# Patient Record
Sex: Male | Born: 1950
Health system: Southern US, Community
[De-identification: ages and names within clinical notes are randomized; demographics above are authoritative.]

## PROBLEM LIST (undated history)

## (undated) DIAGNOSIS — Z973 Presence of spectacles and contact lenses: Secondary | ICD-10-CM

## (undated) DIAGNOSIS — R001 Bradycardia, unspecified: Secondary | ICD-10-CM

## (undated) DIAGNOSIS — G5600 Carpal tunnel syndrome, unspecified upper limb: Secondary | ICD-10-CM

## (undated) DIAGNOSIS — Z87438 Personal history of other diseases of male genital organs: Secondary | ICD-10-CM

## (undated) DIAGNOSIS — N503 Cyst of epididymis: Secondary | ICD-10-CM

## (undated) DIAGNOSIS — M545 Low back pain, unspecified: Secondary | ICD-10-CM

## (undated) DIAGNOSIS — F32A Depression, unspecified: Secondary | ICD-10-CM

## (undated) DIAGNOSIS — L309 Dermatitis, unspecified: Secondary | ICD-10-CM

## (undated) DIAGNOSIS — G5601 Carpal tunnel syndrome, right upper limb: Principal | ICD-10-CM

## (undated) DIAGNOSIS — R55 Syncope and collapse: Secondary | ICD-10-CM

## (undated) DIAGNOSIS — C61 Malignant neoplasm of prostate: Secondary | ICD-10-CM

## (undated) DIAGNOSIS — M1990 Unspecified osteoarthritis, unspecified site: Secondary | ICD-10-CM

## (undated) DIAGNOSIS — J329 Chronic sinusitis, unspecified: Secondary | ICD-10-CM

## (undated) DIAGNOSIS — I499 Cardiac arrhythmia, unspecified: Secondary | ICD-10-CM

## (undated) DIAGNOSIS — I493 Ventricular premature depolarization: Secondary | ICD-10-CM

## (undated) HISTORY — PX: PROSTATE BIOPSY: SHX241

## (undated) HISTORY — PX: COLONOSCOPY WITH PROPOFOL: SHX5780

## (undated) HISTORY — DX: Carpal tunnel syndrome, right upper limb: G56.01

---

## 2007-06-01 ENCOUNTER — Ambulatory Visit (HOSPITAL_BASED_OUTPATIENT_CLINIC_OR_DEPARTMENT_OTHER): Admission: RE | Admit: 2007-06-01 | Discharge: 2007-06-01 | Payer: Self-pay | Admitting: Urology

## 2007-06-01 HISTORY — PX: HYDROCELE EXCISION: SHX482

## 2010-07-17 NOTE — Op Note (Signed)
Calvin Howe, Calvin Howe              ACCOUNT NO.:  [id number]   MEDICAL RECORD NO.:  [id number]          PATIENT TYPE:  AMB   LOCATION:  [facility]                         FACILITY:  [facility]   PHYSICIAN:  Bertram Millard. Dahlstedt, M.D.DATE OF BIRTH:  [birth year]   DATE OF PROCEDURE:  06/01/2007  DATE OF DISCHARGE:                               OPERATIVE REPORT   PREOPERATIVE DIAGNOSIS:  Right hydrocele.   POSTOPERATIVE DIAGNOSIS:  Right hydrocele.   SURGICAL PROCEDURES:  Right hydrocelectomy.   SURGEON:  Bertram Millard. Dahlstedt, M.D.   FIRST ASSISTANT:  Delman Kitten, MD   ANESTHESIA:  General with LMA.   COMPLICATIONS:  None.   BRIEF HISTORY:  This 60 year old gentleman presented to my office late  last month with a right hydrocele.  It has been growing recently, and is  uncomfortable.  It is not huge, but the patient desires to have this  treated surgically.  Risks and complications of a hydrocelectomy were  discussed with the patient.  He understands these and desires to  proceed.   DESCRIPTION OF PROCEDURE:  The patient was identified in the holding  area.  The right side of his body was marked in accordance with same  site practice.  He received IV antibiotics, was taken to the operating  room where general anesthetic was administered using the LMA.  The  genitalia and perineum were prepped and draped.  The 4-cm incision was  made in the anterior median raphe of the scrotum and taken down to  tunica vaginalis with electrocautery.  The hydrocele was dissected  circumferentially with both blunt dissection and electrocautery.  It was  delivered from the scrotum.  His cord was quite thin.  The cord was  blocked with 5 mL of 0.25% plain Marcaine.  The hydrocele sac was opened  in the midline anteriorly taking care to avoid injury to the vas  deferens.  The hydrocele fluid was removed.  There was a small secondary  hydrocele/loculation which was also opened.  The entire anterior aspect  of the  hydrocele was opened, and the hydrocele sac was everted  posteriorly intact with running 2-0 chromics.  There was excellent  eversion, and no tissue was excised.  The testicle appeared normal there  was no evident lesion within the hydrocele sac.  At this point the  inside of the right hemiscrotum was then inspected and found to be  hemostatic.  Since there were no large vessels, there is no significant  bleeding and the hydrocele sac was quite easily imbricated posteriorly,  no drain was left.  The testicle and cord were replaced in the right  hemiscrotum.  The dartos fascia was reapproximated with a running 2-0  chromic.  Skin edges reapproximated using running 3-0  chromic placed in a running subcuticular fashion.  Dry sterile dressings  were placed.  The skin was also infiltrated with the remaining of the 10  mL of Marcaine.  Dry sterile dressings and a jock strap were placed.   The patient tolerated procedure well.  He was awakened, taken to PACU in  stable condition.  Bertram Millard. Dahlstedt, M.D.  Electronically Signed     SMD/MEDQ  D:  06/01/2007  T:  06/01/2007  Job:  [id number]   cc:   Molly Maduro L. Foy [place], M.D.  Fax: [phone]

## 2010-11-26 LAB — POCT HEMOGLOBIN-HEMACUE
Hemoglobin: 16.2
Operator id: 268271

## 2012-06-01 ENCOUNTER — Other Ambulatory Visit (HOSPITAL_BASED_OUTPATIENT_CLINIC_OR_DEPARTMENT_OTHER): Payer: Self-pay | Admitting: Family Medicine

## 2012-06-01 DIAGNOSIS — R748 Abnormal levels of other serum enzymes: Secondary | ICD-10-CM

## 2012-06-02 ENCOUNTER — Ambulatory Visit (HOSPITAL_BASED_OUTPATIENT_CLINIC_OR_DEPARTMENT_OTHER)
Admission: RE | Admit: 2012-06-02 | Discharge: 2012-06-02 | Disposition: A | Discharge status: Home / Self Care | Payer: Federal, State, Local not specified - PPO | Source: Ambulatory Visit | Attending: Family Medicine | Admitting: Family Medicine

## 2012-06-02 DIAGNOSIS — R7989 Other specified abnormal findings of blood chemistry: Secondary | ICD-10-CM | POA: Insufficient documentation

## 2012-06-02 DIAGNOSIS — R5381 Other malaise: Secondary | ICD-10-CM | POA: Insufficient documentation

## 2012-06-02 DIAGNOSIS — R748 Abnormal levels of other serum enzymes: Secondary | ICD-10-CM

## 2014-10-03 HISTORY — PX: COLONOSCOPY WITH PROPOFOL: SHX5780

## 2014-11-08 ENCOUNTER — Other Ambulatory Visit: Payer: Self-pay | Admitting: Urology

## 2014-12-06 ENCOUNTER — Encounter (HOSPITAL_BASED_OUTPATIENT_CLINIC_OR_DEPARTMENT_OTHER): Payer: Self-pay | Admitting: *Deleted

## 2014-12-06 NOTE — Progress Notes (Signed)
NPO AFTER MN.  ARRIVE AT 1000.  NEEDS HG.  

## 2014-12-12 ENCOUNTER — Ambulatory Visit (HOSPITAL_BASED_OUTPATIENT_CLINIC_OR_DEPARTMENT_OTHER): Payer: Federal, State, Local not specified - PPO | Admitting: Anesthesiology

## 2014-12-12 ENCOUNTER — Encounter (HOSPITAL_BASED_OUTPATIENT_CLINIC_OR_DEPARTMENT_OTHER)
Admission: RE | Disposition: A | Discharge status: Home / Self Care | Payer: Self-pay | Source: Ambulatory Visit | Attending: Urology

## 2014-12-12 ENCOUNTER — Ambulatory Visit (HOSPITAL_BASED_OUTPATIENT_CLINIC_OR_DEPARTMENT_OTHER)
Admission: RE | Admit: 2014-12-12 | Discharge: 2014-12-12 | Disposition: A | Discharge status: Home / Self Care | Payer: Federal, State, Local not specified - PPO | Source: Ambulatory Visit | Attending: Urology | Admitting: Urology

## 2014-12-12 ENCOUNTER — Encounter (HOSPITAL_BASED_OUTPATIENT_CLINIC_OR_DEPARTMENT_OTHER): Payer: Self-pay | Admitting: *Deleted

## 2014-12-12 DIAGNOSIS — N50811 Right testicular pain: Secondary | ICD-10-CM | POA: Insufficient documentation

## 2014-12-12 DIAGNOSIS — M1990 Unspecified osteoarthritis, unspecified site: Secondary | ICD-10-CM | POA: Insufficient documentation

## 2014-12-12 DIAGNOSIS — N503 Cyst of epididymis: Secondary | ICD-10-CM | POA: Insufficient documentation

## 2014-12-12 HISTORY — DX: Unspecified osteoarthritis, unspecified site: M19.90

## 2014-12-12 HISTORY — PX: EPIDIDYMECTOMY: SHX6275

## 2014-12-12 HISTORY — DX: Chronic sinusitis, unspecified: J32.9

## 2014-12-12 HISTORY — DX: Cyst of epididymis: N50.3

## 2014-12-12 HISTORY — DX: Dermatitis, unspecified: L30.9

## 2014-12-12 HISTORY — DX: Carpal tunnel syndrome, unspecified upper limb: G56.00

## 2014-12-12 HISTORY — DX: Personal history of other diseases of male genital organs: Z87.438

## 2014-12-12 HISTORY — DX: Presence of spectacles and contact lenses: Z97.3

## 2014-12-12 LAB — POCT HEMOGLOBIN-HEMACUE: Hemoglobin: 15.8 g/dL (ref 13.0–17.0)

## 2014-12-12 SURGERY — EPIDIDYMECTOMY
Anesthesia: General | Laterality: Right

## 2014-12-12 MED ORDER — LIDOCAINE HCL (CARDIAC) 20 MG/ML IV SOLN
INTRAVENOUS | Status: DC | PRN
  Administered 2014-12-12: 80 mg via INTRAVENOUS

## 2014-12-12 MED ORDER — LACTATED RINGERS IV SOLN
INTRAVENOUS | Status: DC
  Administered 2014-12-12: 11:00:00 via INTRAVENOUS
  Filled 2014-12-12: qty 1000

## 2014-12-12 MED ORDER — GLYCOPYRROLATE 0.2 MG/ML IJ SOLN
INTRAMUSCULAR | Status: DC | PRN
  Administered 2014-12-12: 0.2 mg via INTRAVENOUS

## 2014-12-12 MED ORDER — MIDAZOLAM HCL 5 MG/5ML IJ SOLN
INTRAMUSCULAR | Status: DC | PRN
  Administered 2014-12-12: 2 mg via INTRAVENOUS

## 2014-12-12 MED ORDER — PROPOFOL 10 MG/ML IV BOLUS
INTRAVENOUS | Status: DC | PRN
  Administered 2014-12-12: 50 mg via INTRAVENOUS
  Administered 2014-12-12: 200 mg via INTRAVENOUS

## 2014-12-12 MED ORDER — CEFAZOLIN SODIUM-DEXTROSE 2-3 GM-% IV SOLR
2.0000 g | INTRAVENOUS | Status: AC
  Administered 2014-12-12: 2 g via INTRAVENOUS
  Filled 2014-12-12: qty 50

## 2014-12-12 MED ORDER — OXYCODONE-ACETAMINOPHEN 10-325 MG PO TABS: 1.0000 | ORAL_TABLET | ORAL | Status: DC | PRN

## 2014-12-12 MED ORDER — EPHEDRINE SULFATE 50 MG/ML IJ SOLN
INTRAMUSCULAR | Status: DC | PRN
  Administered 2014-12-12: 10 mg via INTRAVENOUS
  Administered 2014-12-12: 15 mg via INTRAVENOUS
  Administered 2014-12-12: 10 mg via INTRAVENOUS
  Administered 2014-12-12: 15 mg via INTRAVENOUS

## 2014-12-12 MED ORDER — ONDANSETRON HCL 4 MG/2ML IJ SOLN
INTRAMUSCULAR | Status: DC | PRN
  Administered 2014-12-12: 4 mg via INTRAVENOUS

## 2014-12-12 MED ORDER — FENTANYL CITRATE (PF) 100 MCG/2ML IJ SOLN
25.0000 ug | INTRAMUSCULAR | Status: DC | PRN
  Filled 2014-12-12: qty 1

## 2014-12-12 MED ORDER — MIDAZOLAM HCL 2 MG/2ML IJ SOLN
INTRAMUSCULAR | Status: AC
  Filled 2014-12-12: qty 2

## 2014-12-12 MED ORDER — CEFAZOLIN SODIUM-DEXTROSE 2-3 GM-% IV SOLR
INTRAVENOUS | Status: AC
  Filled 2014-12-12: qty 50

## 2014-12-12 MED ORDER — ACETAMINOPHEN 10 MG/ML IV SOLN
INTRAVENOUS | Status: DC | PRN
  Administered 2014-12-12: 1000 mg via INTRAVENOUS

## 2014-12-12 MED ORDER — BUPIVACAINE HCL (PF) 0.25 % IJ SOLN
INTRAMUSCULAR | Status: DC | PRN
  Administered 2014-12-12: 20 mL

## 2014-12-12 MED ORDER — CEFAZOLIN SODIUM 1-5 GM-% IV SOLN
1.0000 g | INTRAVENOUS | Status: DC
  Filled 2014-12-12: qty 50

## 2014-12-12 MED ORDER — ONDANSETRON HCL 4 MG/2ML IJ SOLN
4.0000 mg | Freq: Once | INTRAMUSCULAR | Status: DC | PRN
  Filled 2014-12-12: qty 2

## 2014-12-12 MED ORDER — FENTANYL CITRATE (PF) 100 MCG/2ML IJ SOLN
INTRAMUSCULAR | Status: DC | PRN
  Administered 2014-12-12: 100 ug via INTRAVENOUS

## 2014-12-12 MED ORDER — FENTANYL CITRATE (PF) 100 MCG/2ML IJ SOLN
INTRAMUSCULAR | Status: AC
  Filled 2014-12-12: qty 4

## 2014-12-12 MED ORDER — SULFAMETHOXAZOLE-TRIMETHOPRIM 800-160 MG PO TABS: 1.0000 | ORAL_TABLET | Freq: Two times a day (BID) | ORAL | Status: DC

## 2014-12-12 SURGICAL SUPPLY — 44 items
APPLICATOR COTTON TIP 6IN STRL (MISCELLANEOUS) IMPLANT
BLADE CLIPPER SURG (BLADE) IMPLANT
BLADE SURG 15 STRL LF DISP TIS (BLADE) ×1 IMPLANT
BLADE SURG 15 STRL SS (BLADE) ×2
CLEANER CAUTERY TIP 5X5 PAD (MISCELLANEOUS) ×1 IMPLANT
CLOTH BEACON ORANGE TIMEOUT ST (SAFETY) ×3 IMPLANT
COVER BACK TABLE 60X90IN (DRAPES) ×3 IMPLANT
COVER MAYO STAND STRL (DRAPES) ×3 IMPLANT
DRAIN PENROSE 18X1/2 LTX STRL (DRAIN) ×3 IMPLANT
DRAPE PED LAPAROTOMY (DRAPES) ×3 IMPLANT
DRSG KUZMA FLUFF (GAUZE/BANDAGES/DRESSINGS) ×3 IMPLANT
DRSG TEGADERM 4X4.75 (GAUZE/BANDAGES/DRESSINGS) IMPLANT
ELECT REM PT RETURN 9FT ADLT (ELECTROSURGICAL) ×3
ELECTRODE REM PT RTRN 9FT ADLT (ELECTROSURGICAL) ×1 IMPLANT
GAUZE SPONGE 4X4 16PLY XRAY LF (GAUZE/BANDAGES/DRESSINGS) ×3 IMPLANT
GLOVE BIO SURGEON STRL SZ 6.5 (GLOVE) ×2 IMPLANT
GLOVE BIO SURGEON STRL SZ8 (GLOVE) ×3 IMPLANT
GLOVE BIO SURGEONS STRL SZ 6.5 (GLOVE) ×1
GLOVE INDICATOR 6.5 STRL GRN (GLOVE) ×3 IMPLANT
GLOVE INDICATOR 7.5 STRL GRN (GLOVE) ×3 IMPLANT
GOWN STRL REUS W/ TWL LRG LVL3 (GOWN DISPOSABLE) ×1 IMPLANT
GOWN STRL REUS W/ TWL XL LVL3 (GOWN DISPOSABLE) ×1 IMPLANT
GOWN STRL REUS W/TWL LRG LVL3 (GOWN DISPOSABLE) ×2
GOWN STRL REUS W/TWL XL LVL3 (GOWN DISPOSABLE) ×2
LIQUID BAND (GAUZE/BANDAGES/DRESSINGS) ×3 IMPLANT
NEEDLE HYPO 25X1 1.5 SAFETY (NEEDLE) ×3 IMPLANT
NS IRRIG 500ML POUR BTL (IV SOLUTION) ×3 IMPLANT
PACK BASIN DAY SURGERY FS (CUSTOM PROCEDURE TRAY) ×3 IMPLANT
PAD CLEANER CAUTERY TIP 5X5 (MISCELLANEOUS) ×2
PENCIL BUTTON HOLSTER BLD 10FT (ELECTRODE) ×3 IMPLANT
SUPPORT SCROTAL LG STRP (MISCELLANEOUS) ×2 IMPLANT
SUPPORTER ATHLETIC LG (MISCELLANEOUS) ×1
SUT CHROMIC 3 0 PS 2 (SUTURE) ×6 IMPLANT
SUT MNCRL AB 4-0 PS2 18 (SUTURE) ×3 IMPLANT
SUT VIC AB 3-0 SH 27 (SUTURE) ×6
SUT VIC AB 3-0 SH 27X BRD (SUTURE) ×3 IMPLANT
SUT VIC AB 4-0 SH 27 (SUTURE) ×2
SUT VIC AB 4-0 SH 27XANBCTRL (SUTURE) ×1 IMPLANT
SYR CONTROL 10ML LL (SYRINGE) ×3 IMPLANT
TOWEL OR 17X24 6PK STRL BLUE (TOWEL DISPOSABLE) ×6 IMPLANT
TRAY DSU PREP LF (CUSTOM PROCEDURE TRAY) ×3 IMPLANT
TUBE CONNECTING 12'X1/4 (SUCTIONS)
TUBE CONNECTING 12X1/4 (SUCTIONS) IMPLANT
YANKAUER SUCT BULB TIP NO VENT (SUCTIONS) ×3 IMPLANT

## 2014-12-12 NOTE — H&P (Signed)
Urology Admission H&P  Chief Complaint: right testicular pain  History of Present Illness: Calvin Howe is a 64yo with a hx of right testicular pain who was found to have a large right epididymal cyst. He continues to have dull intermittent nonradiaitng right  testiocular pain that is worse with movement.  Past Medical History  Diagnosis Date  . Epididymal cyst     right  . History of hydrocele   . Carpal tunnel syndrome   . Sinus infection     STARTED ANTIBIOTIC 12-01-2014  . Arthritis   . Wears glasses   . Eczema    Past Surgical History  Procedure Laterality Date  . Hydrocele excision Right 06-01-2007  . Colonoscopy with propofol  Aug 2016    Home Medications:  Prescriptions prior to admission  Medication Sig Dispense Refill Last Dose  . ibuprofen (ADVIL,MOTRIN) 800 MG tablet Take 800 mg by mouth every 8 (eight) hours as needed.   Past Month at Unknown time   Allergies: No Known Allergies  History reviewed. No pertinent family history. Social History:  reports that he has never smoked. He has never used smokeless tobacco. He reports that he drinks alcohol. He reports that he does not use illicit drugs.  Review of Systems  All other systems reviewed and are negative.   Physical Exam:  Vital signs in last 24 hours: Temp:  [97.9 F (36.6 C)] 97.9 F (36.6 C) (10/10 1015) Pulse Rate:  [58] 58 (10/10 1015) Resp:  [16] 16 (10/10 1015) BP: (138)/(77) 138/77 mmHg (10/10 1015) SpO2:  [100 %] 100 % (10/10 1015) Weight:  [77.338 kg (170 lb 8 oz)] 77.338 kg (170 lb 8 oz) (10/10 1015) Physical Exam  Constitutional: He is oriented to person, place, and time. He appears well-developed and well-nourished.  HENT:  Head: Normocephalic and atraumatic.  Eyes: EOM are normal. Pupils are equal, round, and reactive to light.  Neck: Normal range of motion. No thyromegaly present.  Cardiovascular: Normal rate and regular rhythm.   Respiratory: Effort normal. No respiratory distress.   GI: Soft. He exhibits no distension.  Genitourinary: Penis normal.  Musculoskeletal: Normal range of motion.  Neurological: He is alert and oriented to person, place, and time.  Skin: Skin is warm and dry.  Psychiatric: He has a normal mood and affect. His behavior is normal. Judgment and thought content normal.    Laboratory Data:  Results for orders placed or performed during the hospital encounter of 12/12/14 (from the past 24 hour(s))  Hemoglobin-hemacue, POC     Status: None   Collection Time: 12/12/14 11:15 AM  Result Value Ref Range   Hemoglobin 15.8 13.0 - 17.0 g/dL   No results found for this or any previous visit (from the past 240 hour(s)). Creatinine: No results for input(s): CREATININE in the last 168 hours. Baseline Creatinine: unknown  Impression/Assessment:  64yo with right epididymal cyst  Plan:  The risks/benefits/alternatives to right epididymal cyst excision was explained to the patient and he understands and wishes to proceed with surgery  Ted Goodner L 12/12/2014, 11:18 AM

## 2014-12-12 NOTE — Brief Op Note (Signed)
12/12/2014  1:01 PM  PATIENT:  Calvin Howe  64 y.o. male  PRE-OPERATIVE DIAGNOSIS:  RIGHT EPIDIDYMAL CYST  POST-OPERATIVE DIAGNOSIS:  RIGHT EPIDIDYMAL CYST  PROCEDURE:  Procedure(s): EPIDIDYMAL CYST EXCISION (Right)  SURGEON:  Surgeon(s) and Role:    * Cleon Gustin, MD - Primary  PHYSICIAN ASSISTANT:   ASSISTANTS: none   ANESTHESIA:   general  EBL:  Total I/O In: 750 [I.V.:750] Out: -   BLOOD ADMINISTERED:none  DRAINS: none   LOCAL MEDICATIONS USED:  MARCAINE     SPECIMEN:  Source of Specimen:  right epididymal cyst  DISPOSITION OF SPECIMEN:  PATHOLOGY  COUNTS:  YES  TOURNIQUET:  * No tourniquets in log *  DICTATION: .Note written in EPIC  PLAN OF CARE: Discharge to home after PACU  PATIENT DISPOSITION:  PACU - hemodynamically stable.   Delay start of Pharmacological VTE agent (>24hrs) due to surgical blood loss or risk of bleeding: not applicable

## 2014-12-12 NOTE — Anesthesia Procedure Notes (Signed)
Procedure Name: LMA Insertion Date/Time: 12/12/2014 11:40 AM Performed by: Wanita Chamberlain Pre-anesthesia Checklist: Patient identified, Timeout performed, Emergency Drugs available, Suction available and Patient being monitored Patient Re-evaluated:Patient Re-evaluated prior to inductionOxygen Delivery Method: Circle system utilized Preoxygenation: Pre-oxygenation with 100% oxygen Intubation Type: IV induction Ventilation: Mask ventilation without difficulty LMA: LMA inserted LMA Size: 5.0 Number of attempts: 1 Airway Equipment and Method: Bite block Placement Confirmation: breath sounds checked- equal and bilateral and positive ETCO2 Tube secured with: Tape Dental Injury: Teeth and Oropharynx as per pre-operative assessment

## 2014-12-12 NOTE — Anesthesia Preprocedure Evaluation (Addendum)
Anesthesia Evaluation  Patient identified by MRN, date of birth, ID band Patient awake    Reviewed: Allergy & Precautions, H&P , NPO status , Patient's Chart, lab work & pertinent test results  History of Anesthesia Complications Negative for: history of anesthetic complications  Airway Mallampati: I  TM Distance: >3 FB Neck ROM: full   Comment: Beard present Dental no notable dental hx. (+) Dental Advisory Given, Teeth Intact   Pulmonary neg pulmonary ROS,    Pulmonary exam normal breath sounds clear to auscultation       Cardiovascular negative cardio ROS Normal cardiovascular exam Rhythm:regular Rate:Normal     Neuro/Psych negative neurological ROS     GI/Hepatic negative GI ROS, Neg liver ROS,   Endo/Other  negative endocrine ROS  Renal/GU negative Renal ROS     Musculoskeletal  (+) Arthritis ,   Abdominal   Peds  Hematology negative hematology ROS (+)   Anesthesia Other Findings   Reproductive/Obstetrics negative OB ROS                          Anesthesia Physical Anesthesia Plan  ASA: II  Anesthesia Plan: General   Post-op Pain Management:    Induction: Intravenous  Airway Management Planned: LMA  Additional Equipment:   Intra-op Plan:   Post-operative Plan: Extubation in OR  Informed Consent: I have reviewed the patients History and Physical, chart, labs and discussed the procedure including the risks, benefits and alternatives for the proposed anesthesia with the patient or authorized representative who has indicated his/her understanding and acceptance.   Dental Advisory Given  Plan Discussed with: Anesthesiologist, CRNA and Surgeon  Anesthesia Plan Comments:         Anesthesia Quick Evaluation

## 2014-12-12 NOTE — Transfer of Care (Signed)
Immediate Anesthesia Transfer of Care Note  Patient: Calvin Howe  Procedure(s) Performed: Procedure(s): EPIDIDYMAL CYST EXCISION (Right)  Patient Location: PACU  Anesthesia Type:General  Level of Consciousness: awake, alert , oriented and patient cooperative  Airway & Oxygen Therapy: Patient Spontanous Breathing and Patient connected to nasal cannula oxygen  Post-op Assessment: Report given to RN and Post -op Vital signs reviewed and stable  Post vital signs: Reviewed and stable  Last Vitals:  Filed Vitals:   12/12/14 1015  BP: 138/77  Pulse: 58  Temp: 36.6 C  Resp: 16    Complications: No apparent anesthesia complications

## 2014-12-12 NOTE — Discharge Instructions (Signed)
HOME CARE INSTRUCTIONS FOR SCROTAL PROCEDURES  Wound Care & Hygiene: You may apply an ice bag to the scrotum for the first 24 hours.  This may help decrease swelling and soreness.  You may have a dressing held in place by an athletic supporter.  You may remove the dressing in 24 hours and shower in 48 hours.  Continue to use the athletic supporter or tight briefs for at least a week. Activity: Rest today - not necessarily flat bed rest.  Just take it easy.  You should not do strenuous activities until your follow-up visit with your doctor.  You may resume light activity in 48 hours.  Return to Work:  Your doctor will advise you of this depending on the type of work you do  Diet: Drink liquids or eat a light diet this evening.  You may resume a regular diet tomorrow.  General Expectations: You may have a small amount of bleeding.  The scrotum may be swollen or bruised for about a week.  Call your Doctor if these occur:  -persistent or heavy bleeding  -temperature of 101 degrees or more  -severe pain, not relieved by your pain medication  Return to [employer]:  Call to set up and appointment.     Post Anesthesia Home Care Instructions  Activity: Get plenty of rest for the remainder of the day. A responsible adult should stay with you for 24 hours following the procedure.  For the next 24 hours, DO NOT: -Drive a car -Paediatric nurse -Drink alcoholic beverages -Take any medication unless instructed by your physician -Make any legal decisions or sign important papers.  Meals: Start with liquid foods such as gelatin or soup. Progress to regular foods as tolerated. Avoid greasy, spicy, heavy foods. If nausea and/or vomiting occur, drink only clear liquids until the nausea and/or vomiting subsides. Call your physician if vomiting continues.  Special Instructions/Symptoms: Your throat may feel dry or sore from the anesthesia or the breathing tube placed in your throat during  surgery. If this causes discomfort, gargle with warm salt water. The discomfort should disappear within 24 hours.  If you had a scopolamine patch placed behind your ear for the management of post- operative nausea and/or vomiting:  1. The medication in the patch is effective for 72 hours, after which it should be removed.  Wrap patch in a tissue and discard in the trash. Wash hands thoroughly with soap and water. 2. You may remove the patch earlier than 72 hours if you experience unpleasant side effects which may include dry mouth, dizziness or visual disturbances. 3. Avoid touching the patch. Wash your hands with soap and water after contact with the patch.   _

## 2014-12-12 NOTE — Anesthesia Postprocedure Evaluation (Signed)
  Anesthesia Post-op Note  Patient: Calvin Howe  Procedure(s) Performed: Procedure(s) (LRB): EPIDIDYMAL CYST EXCISION (Right)  Patient Location: PACU  Anesthesia Type: General  Level of Consciousness: awake and alert   Airway and Oxygen Therapy: Patient Spontanous Breathing  Post-op Pain: mild  Post-op Assessment: Post-op Vital signs reviewed, Patient's Cardiovascular Status Stable, Respiratory Function Stable, Patent Airway and No signs of Nausea or vomiting  Last Vitals:  Filed Vitals:   12/12/14 1306  BP: 151/89  Pulse: 73  Temp: 36.9 C  Resp: 14    Post-op Vital Signs: stable   Complications: No apparent anesthesia complications

## 2014-12-13 ENCOUNTER — Encounter (HOSPITAL_BASED_OUTPATIENT_CLINIC_OR_DEPARTMENT_OTHER): Payer: Self-pay | Admitting: Urology

## 2014-12-15 NOTE — Op Note (Signed)
Preoperative diagnosis: Right epididymal cyst  Postoperative diagnosis: right epididymal cyst  Procedure: 1. Excision of right epididymal cyst 2. Lysis of adhesions  Attending: Nicolette Bang, MD  Anesthesia: General  History of blood loss: Minimal  Antibiotics: ancef  Drains: none  Specimens: 1. right epididymal cyst  Findings: 6cm right epididymal cyst.  Indications: Patient is a 64 year old male with a history of right epididymal cyst that was growing in size and causing him pain with walking. HE had a previous hydrocele repair on the right. We discussed the treatment options including observation versus excision after discussing treatment options he decided to proceed with excision.   Procedure in detail: Prior to procedure consent was obtained.  Patient was brought to the operating room and a brief timeout was done to ensure correct patient, correct procedure, correct site.  General anesthesia was administered and patient was placed in supine position.  His genitalia was then prepped and draped in usual sterile fashion.  A 3 cm incision was made in the right hemiscrotum.  We dissected down to the tunica and then incised the tunica. We noted dense adhesions from previous hydrocelectomy which made dissection difficult. We then used sharp dissection to excision the right epididymal cyst intact. This was then sent for pathology. Hemostasis was then obtained with electrocautery. We then closed the defect in the epididymis with 3-0 vicryl in a running fashion. We then returned the testis to the right hemiscrotum and closed the overlying dartos with 3-0 vicryl in a running fashion. The skin was then closed with 3-0 chromic in a running fashion. Dermabond was placed on the incision.  A dressing was then applied to the incision.  We then placed a scrotal fluff and this then concluded the procedure which was well tolerated by the patient.  Complications: None  Condition: Stable, extubated,  transferred to PACU.  Plan: Patient is to be discharged home.  He is to follow up in 2 weeks for wound check.

## 2015-06-26 ENCOUNTER — Ambulatory Visit (INDEPENDENT_AMBULATORY_CARE_PROVIDER_SITE_OTHER): Payer: Federal, State, Local not specified - PPO | Admitting: Neurology

## 2015-06-26 ENCOUNTER — Encounter: Payer: Self-pay | Admitting: Neurology

## 2015-06-26 ENCOUNTER — Ambulatory Visit (INDEPENDENT_AMBULATORY_CARE_PROVIDER_SITE_OTHER): Payer: Self-pay | Admitting: Neurology

## 2015-06-26 DIAGNOSIS — G5601 Carpal tunnel syndrome, right upper limb: Secondary | ICD-10-CM | POA: Diagnosis not present

## 2015-06-26 HISTORY — DX: Carpal tunnel syndrome, right upper limb: G56.01

## 2015-06-26 NOTE — Procedures (Signed)
     HISTORY:  Calvin Howe is a 65 year old gentleman with a several year history of some numbness of the right hand and some discomfort at the wrist. The patient has had some gradual worsening of the symptoms recently. He also reports some pain in the left wrist with movement of the wrist. He has no numbness of the left hand. He is sent for evaluation of a possible neuropathy. The patient denies any significant neck pain or pain down the arm.  NERVE CONDUCTION STUDIES:  Nerve conduction studies were performed on both upper extremities. The distal motor latencies for the median nerves were prolonged on the right, normal on the left, with a borderline normal motor amplitude on the right, normal on the left. The distal motor latencies and motor amplitudes for the ulnar nerves were normal bilaterally. The F wave latencies for the median nerves were prolonged on the right, normal on the left, and normal for the ulnar nerves bilaterally. The nerve conduction velocities for the median and ulnar nerves were normal bilaterally. The sensory latencies for the median nerves were prolonged on the right, normal on the left, and normal for the ulnar nerves bilaterally.  EMG STUDIES:  EMG study was performed on the right upper extremity:  The first dorsal interosseous muscle reveals 2 to 5 K units with reduced recruitment. No fibrillations or positive waves were noted. The abductor pollicis brevis muscle reveals 2 to 4 K units with decreased recruitment. No fibrillations or positive waves were noted. The extensor indicis proprius muscle reveals 1 to 3 K units with full recruitment. No fibrillations or positive waves were noted. The pronator teres muscle reveals 2 to 3 K units with full recruitment. No fibrillations or positive waves were noted. The biceps muscle reveals 1 to 2 K units with full recruitment. No fibrillations or positive waves were noted. The triceps muscle reveals 2 to 4 K units with full  recruitment. No fibrillations or positive waves were noted. The anterior deltoid muscle reveals 2 to 3 K units with full recruitment. No fibrillations or positive waves were noted. The cervical paraspinal muscles were tested at 2 levels. No abnormalities of insertional activity were seen at either level tested. There was good relaxation.   IMPRESSION:  Nerve conduction studies done on both upper extremities shows evidence of a mild to moderate right carpal tunnel syndrome. No other significant abnormalities were seen. EMG evaluation of the right upper extremity shows no evidence of an overlying cervical radiculopathy.  Jill Alexanders MD 06/26/2015 4:58 PM  [facility] [address] [place], [place] [address]  Phone [phone] Fax [phone]

## 2015-06-26 NOTE — Progress Notes (Signed)
Please refer to EMG and nerve conduction study procedure note. 

## 2015-07-10 DIAGNOSIS — M47812 Spondylosis without myelopathy or radiculopathy, cervical region: Secondary | ICD-10-CM | POA: Insufficient documentation

## 2015-07-10 DIAGNOSIS — M19131 Post-traumatic osteoarthritis, right wrist: Secondary | ICD-10-CM | POA: Insufficient documentation

## 2015-07-10 DIAGNOSIS — R52 Pain, unspecified: Secondary | ICD-10-CM | POA: Insufficient documentation

## 2015-08-08 ENCOUNTER — Other Ambulatory Visit: Payer: Self-pay | Admitting: Orthopaedic Surgery

## 2015-08-08 DIAGNOSIS — M47812 Spondylosis without myelopathy or radiculopathy, cervical region: Secondary | ICD-10-CM

## 2015-08-28 DIAGNOSIS — M1812 Unilateral primary osteoarthritis of first carpometacarpal joint, left hand: Secondary | ICD-10-CM | POA: Insufficient documentation

## 2015-09-01 ENCOUNTER — Other Ambulatory Visit: Payer: Federal, State, Local not specified - PPO

## 2016-01-31 DIAGNOSIS — I493 Ventricular premature depolarization: Secondary | ICD-10-CM | POA: Insufficient documentation

## 2016-01-31 DIAGNOSIS — I499 Cardiac arrhythmia, unspecified: Secondary | ICD-10-CM | POA: Insufficient documentation

## 2016-01-31 DIAGNOSIS — R9431 Abnormal electrocardiogram [ECG] [EKG]: Secondary | ICD-10-CM | POA: Insufficient documentation

## 2016-02-01 ENCOUNTER — Ambulatory Visit (INDEPENDENT_AMBULATORY_CARE_PROVIDER_SITE_OTHER): Payer: Federal, State, Local not specified - PPO

## 2016-02-01 DIAGNOSIS — R9431 Abnormal electrocardiogram [ECG] [EKG]: Secondary | ICD-10-CM | POA: Diagnosis not present

## 2016-02-01 DIAGNOSIS — I499 Cardiac arrhythmia, unspecified: Secondary | ICD-10-CM | POA: Diagnosis not present

## 2016-02-01 DIAGNOSIS — I493 Ventricular premature depolarization: Secondary | ICD-10-CM

## 2016-07-03 DIAGNOSIS — C44629 Squamous cell carcinoma of skin of left upper limb, including shoulder: Secondary | ICD-10-CM | POA: Diagnosis not present

## 2017-07-09 ENCOUNTER — Ambulatory Visit
Admission: RE | Admit: 2017-07-09 | Discharge: 2017-07-09 | Disposition: A | Discharge status: Home / Self Care | Payer: Federal, State, Local not specified - PPO | Source: Ambulatory Visit | Attending: Family Medicine | Admitting: Family Medicine

## 2017-07-09 ENCOUNTER — Other Ambulatory Visit: Payer: Self-pay | Admitting: Family Medicine

## 2017-07-09 DIAGNOSIS — M79602 Pain in left arm: Principal | ICD-10-CM

## 2017-07-09 DIAGNOSIS — M79601 Pain in right arm: Secondary | ICD-10-CM

## 2017-08-25 ENCOUNTER — Other Ambulatory Visit: Payer: Self-pay | Admitting: Surgery

## 2017-08-25 DIAGNOSIS — R5381 Other malaise: Secondary | ICD-10-CM

## 2017-09-01 ENCOUNTER — Other Ambulatory Visit (HOSPITAL_COMMUNITY): Payer: Self-pay | Admitting: Surgery

## 2017-09-01 DIAGNOSIS — R599 Enlarged lymph nodes, unspecified: Secondary | ICD-10-CM

## 2017-09-03 ENCOUNTER — Ambulatory Visit (HOSPITAL_COMMUNITY)
Admission: RE | Admit: 2017-09-03 | Discharge: 2017-09-03 | Disposition: A | Discharge status: Home / Self Care | Payer: Medicare Other | Source: Ambulatory Visit | Attending: Surgery | Admitting: Surgery

## 2017-09-03 DIAGNOSIS — R599 Enlarged lymph nodes, unspecified: Secondary | ICD-10-CM | POA: Insufficient documentation

## 2017-09-10 DIAGNOSIS — R1013 Epigastric pain: Secondary | ICD-10-CM | POA: Diagnosis not present

## 2017-09-26 DIAGNOSIS — D225 Melanocytic nevi of trunk: Secondary | ICD-10-CM | POA: Diagnosis not present

## 2017-09-26 DIAGNOSIS — X32XXXD Exposure to sunlight, subsequent encounter: Secondary | ICD-10-CM | POA: Diagnosis not present

## 2017-09-26 DIAGNOSIS — L821 Other seborrheic keratosis: Secondary | ICD-10-CM | POA: Diagnosis not present

## 2017-09-26 DIAGNOSIS — L570 Actinic keratosis: Secondary | ICD-10-CM | POA: Diagnosis not present

## 2017-10-31 DIAGNOSIS — K219 Gastro-esophageal reflux disease without esophagitis: Secondary | ICD-10-CM | POA: Diagnosis not present

## 2017-10-31 DIAGNOSIS — N5089 Other specified disorders of the male genital organs: Secondary | ICD-10-CM | POA: Diagnosis not present

## 2017-11-05 ENCOUNTER — Other Ambulatory Visit: Payer: Self-pay | Admitting: Family Medicine

## 2017-11-06 DIAGNOSIS — Z23 Encounter for immunization: Secondary | ICD-10-CM | POA: Diagnosis not present

## 2017-11-07 ENCOUNTER — Other Ambulatory Visit: Payer: Self-pay | Admitting: Family Medicine

## 2017-11-07 DIAGNOSIS — N5089 Other specified disorders of the male genital organs: Secondary | ICD-10-CM

## 2017-11-11 ENCOUNTER — Ambulatory Visit
Admission: RE | Admit: 2017-11-11 | Discharge: 2017-11-11 | Disposition: A | Discharge status: Home / Self Care | Payer: Medicare Other | Source: Ambulatory Visit | Attending: Family Medicine | Admitting: Family Medicine

## 2017-11-11 DIAGNOSIS — N5089 Other specified disorders of the male genital organs: Secondary | ICD-10-CM | POA: Diagnosis not present

## 2017-11-14 DIAGNOSIS — N448 Other noninflammatory disorders of the testis: Secondary | ICD-10-CM | POA: Diagnosis not present

## 2017-11-24 DIAGNOSIS — R079 Chest pain, unspecified: Secondary | ICD-10-CM | POA: Diagnosis not present

## 2017-11-24 DIAGNOSIS — R140 Abdominal distension (gaseous): Secondary | ICD-10-CM | POA: Diagnosis not present

## 2017-11-24 DIAGNOSIS — K219 Gastro-esophageal reflux disease without esophagitis: Secondary | ICD-10-CM | POA: Diagnosis not present

## 2017-11-26 ENCOUNTER — Encounter: Payer: Self-pay | Admitting: Cardiology

## 2017-11-26 ENCOUNTER — Ambulatory Visit (INDEPENDENT_AMBULATORY_CARE_PROVIDER_SITE_OTHER): Payer: Medicare Other | Admitting: Cardiology

## 2017-11-26 VITALS — BP 116/78 | HR 66 | Ht 73.0 in | Wt 176.0 lb

## 2017-11-26 DIAGNOSIS — Z7189 Other specified counseling: Secondary | ICD-10-CM | POA: Diagnosis not present

## 2017-11-26 DIAGNOSIS — R072 Precordial pain: Secondary | ICD-10-CM

## 2017-11-26 NOTE — Progress Notes (Signed)
Cardiology Office Note:    Date:  11/26/2017   ID:  Calvin Howe, DOB 04/08/1950, MRN [id number]  PCP:  Aura Dials, MD  Cardiologist:  Buford Dresser, MD PhD  Referring MD: Wilford Corner, MD   Chief Complaint  Patient presents with  . Follow-up    Experiencing gas and bloating.  . Chest Pain    This past Saturday.    History of Present Illness:    Calvin Howe is a 67 y.o. male with a hx of carpal tunnel syndrome, arthritis who is seen as a new consult at the request of Wilford Corner, MD for the evaluation and management of chest pain.  Per notes received from Dr. Kathline Magic office, he had an episode of chest tightness on 9/21 that lasted for several hours and improved after taking an antacid.  Chest pain: -Initial onset: Around 3 PM, just sitting down to eat, took a few bites and noted tightness spreading across bilateral chest. Had drank coffee and a beer but no real food prior to that. Monitored BP and HR, was not very elevated. Tightness eased over time, lasted several hours but was nearly gone. He then at a piece of cake and noted that the tightness started to come back. Gradually eased off, was gone by that evening. No shortness of breath, nausea, diaphoresis. Radiated across bilateral chest, had some rear bicep tightness -Quality: tightness -Frequency: first time -Duration: several hours -Triggers: unclear -Aggravating/alleviating factors: better with rest, worse when he ate -Prior cardiac history: none -Prior ECG: reviewed -Prior workup: had a heart monitor for a month in the past, done after abnormal ECG. Was told it was normal  -Prior treatment: none -Alcohol: 5x/week, drinks a glass of whiskey those evenings, occasional beer -Tobacco: never -Comorbidities: no thyroid, diabetes, lung issues. Rare ibuprofen use, maybe 1-2x/week.  -Exercise level: doesn't do intentional exercise, but is active at his baseline. Restores cars, gardens. Can climb  stairs, etc.  -Labs: TSH , kidney function/electrolytes , CBC . -Cardiac ROS: no shortness of breath, no PND, no orthopnea, no LE edema, no syncope -Family history: dad had MI in his mid-50s, was a smoker. Lived to his mid-80s without any further issues. Mother found to have a hole in her heart in late adulthood (in her 60s)--congenital but not found until later in life. Had a pacemaker, passed away at age 47. Sister with cerebral palsy.    Family history: father passed at age 55, history of MI/CAD, multiple myeloma. Mother passed away at age 66, had congenital heart disease, pacemaker.  Social history: no tobacco, occasional alcohol (about 3 drinks/week). Walks about a mile per day, drinks 3 cups of coffee daily. Married, two children, retired.  Past Medical History:  Diagnosis Date  . Arthritis   . Carpal tunnel syndrome   . Eczema   . Epididymal cyst    right  . History of hydrocele   . Right carpal tunnel syndrome 06/26/2015  . Sinus infection    STARTED ANTIBIOTIC 12-01-2014  . Wears glasses     Past Surgical History:  Procedure Laterality Date  . COLONOSCOPY WITH PROPOFOL  Aug 2016  . EPIDIDYMECTOMY Right 12/12/2014   Procedure: EPIDIDYMAL CYST EXCISION;  Surgeon: Cleon Gustin, MD;  Location: [facility];  Service: Urology;  Laterality: Right;  . HYDROCELE EXCISION Right 06-01-2007    Current Medications: Current Outpatient Medications on File Prior to Visit  Medication Sig  . ibuprofen (ADVIL,MOTRIN) 800 MG tablet Take 800 mg by mouth every  8 (eight) hours as needed.   No current facility-administered medications on file prior to visit.      Allergies:   Patient has no known allergies.   Social History   Socioeconomic History  . Marital status: Married    Spouse name: Not on file  . Number of children: Not on file  . Years of education: Not on file  . Highest education level: Not on file  Occupational History  . Not on file  Social Needs    . Financial resource strain: Not on file  . Food insecurity:    Worry: Not on file    Inability: Not on file  . Transportation needs:    Medical: Not on file    Non-medical: Not on file  Tobacco Use  . Smoking status: Never Smoker  . Smokeless tobacco: Never Used  Substance and Sexual Activity  . Alcohol use: Yes    Comment: OCCASIONAL  . Drug use: No  . Sexual activity: Not on file  Lifestyle  . Physical activity:    Days per week: Not on file    Minutes per session: Not on file  . Stress: Not on file  Relationships  . Social connections:    Talks on phone: Not on file    Gets together: Not on file    Attends religious service: Not on file    Active member of club or organization: Not on file    Attends meetings of clubs or organizations: Not on file    Relationship status: Not on file  Other Topics Concern  . Not on file  Social History Narrative  . Not on file     Family History: The patient's family history includes Arthritis in his mother; Heart disease in his father and mother; Multiple myeloma in his father; Supraventricular tachycardia in his mother.  ROS:   Please see the history of present illness.  Additional pertinent ROS:  Constitutional: Negative for chills, fever, night sweats, unintentional weight loss  HENT: Negative for ear pain and hearing loss.   Eyes: Negative for loss of vision and eye pain.  Respiratory: Negative for cough, sputum, shortness of breath, wheezing.   Cardiovascular: Positive for chest tightness as per HPI. Negative for palpitations , PND, orthopnea, lower extremity edema and claudication.  Gastrointestinal: Negative for abdominal pain, melena, and hematochezia.  Genitourinary: Negative for dysuria and hematuria.  Musculoskeletal: Negative for falls and myalgias.  Skin: Negative for itching and rash.  Neurological: Negative for focal weakness, focal sensory changes and loss of consciousness.  Endo/Heme/Allergies: Does not  bruise/bleed easily.    EKGs/Labs/Other Studies Reviewed:    The following studies were reviewed today: Prior notes  EKG:  EKG is ordered today.  The ekg ordered today demonstrates normal sinus rhythm, left axis deviation, IVCD  Recent Labs: No results found for requested labs within last 8760 hours.  Recent Lipid Panel No results found for: CHOL, TRIG, HDL, CHOLHDL, VLDL, LDLCALC, LDLDIRECT  [facility] labs 01/28/17 Tchol 191, HDL 63, LDL 111 No A1c TSH 1.78  Physical Exam:    VS:  BP 116/78 (BP Location: Left Arm, Patient Position: Sitting, Cuff Size: Normal)   Pulse 66   Ht 6' 1"  (1.854 m)   Wt 176 lb (79.8 kg)   BMI 23.22 kg/m     Wt Readings from Last 3 Encounters:  11/26/17 176 lb (79.8 kg)  12/12/14 170 lb 8 oz (77.3 kg)     GEN: Well nourished, well developed in  no acute distress HEENT: Normal NECK: No JVD; No carotid bruits LYMPHATICS: No lymphadenopathy CARDIAC: regular rhythm, normal S1 and S2, no murmurs, rubs, gallops. Radial and DP pulses 2+ bilaterally. RESPIRATORY:  Clear to auscultation without rales, wheezing or rhonchi  ABDOMEN: Soft, non-tender, non-distended MUSCULOSKELETAL:  No edema; No deformity  SKIN: Warm and dry NEUROLOGIC:  Alert and oriented x 3 PSYCHIATRIC:  Normal affect   ASSESSMENT:    1. Precordial chest pain   2. Counseling on health promotion and disease prevention    PLAN:    1. Chest tightness: given the relationship to food and the overall quality, it is atypical for traditional angina. However, he has moderate ASCVD risk, and with plans for possibly upcoming GI procedure, it is reasonable to proceed with further evaluation.  -he can treadmill, which will give [place] valuable information. His baseline ECG has conduction delay, and I am concerned that at fast heart rates this may be uninterpretable/become a LBBB pattern. Given this, I will order imaging as well so as to have an interpretable test.  2. Prevention ASCVD risk score:  10.8% (optimal 10.4%) Prevention: -recommend heart healthy/Mediterranean diet, with whole grains, fruits, vegetable, fish, lean meats, nuts, and olive oil. Limit salt. -recommend moderate walking, 3-5 times/week for 30-50 minutes each session. Aim for at least 150 minutes.week. Goal should be pace of 3 miles/hours, or walking 1.5 miles in 30 minutes. Can walk 1.5 mi in 30 minutes, rides stationary bike for 20 min at a time. -recommend avoidance of tobacco products. Avoid excess alcohol. -Additional risk factor control:  -Diabetes: A1c is not available, recommend performing as a screening test by PCP  -Lipids: 01/2017 Tchol 191, LDL 111, HDL 63, TG 80  -Blood pressure control: at goal, not on medications  -Weight: BMI 23  Plan for follow up: follow up PRN based on results of testing  Medication Adjustments/Labs and Tests Ordered: Current medicines are reviewed at length with the patient today.  Concerns regarding medicines are outlined above.  Orders Placed This Encounter  Procedures  . MYOCARDIAL PERFUSION IMAGING  . EKG 12-Lead   No orders of the defined types were placed in this encounter.   Patient Instructions  Medication Instructions: Dr Harrell Gave recommends that you continue on your current medications as directed. Please refer to the Current Medication list given to you today.  Labwork: NONE ORDERED  Testing/Procedures: 1. Treadmill Myoview Stress Test - Your physician has ordered a Myocardial Perfusion Imaging Study.   The test will take approximately 3 to 4 hours to complete; you may bring reading material.  If someone comes with you to your appointment, they will need to remain in the main lobby due to limited space in the testing area. **If you are pregnant or breastfeeding, please notify the nuclear lab prior to your appointment**  You will need to hold the following medications prior to your stress test: N/A   How to prepare for your Myocardial Perfusion  Test:  Do not eat or drink 3 hours prior to your test, except you may have water.  Do not consume products containing caffeine (regular or decaffeinated) 12 hours prior to your test. (ex: coffee, chocolate, sodas, tea).  Do wear comfortable clothes (no dresses or overalls) and walking shoes, tennis shoes preferred (No heels or open toe shoes are allowed).  Do NOT wear cologne, perfume, aftershave, or lotions (deodorant is allowed).  If these instructions are not followed, your test will have to be rescheduled.  Follow-up: Dr Harrell Gave recommends  that you follow-up with her as needed.  If you need a refill on your cardiac medications before your next appointment, please call your pharmacy.    Signed, Buford Dresser, MD PhD 11/26/2017 5:10 PM    [facility]

## 2017-11-26 NOTE — Patient Instructions (Addendum)
Medication Instructions: Dr Harrell Gave recommends that you continue on your current medications as directed. Please refer to the Current Medication list given to you today.  Labwork: NONE ORDERED  Testing/Procedures: 1. Treadmill Myoview Stress Test - Your physician has ordered a Myocardial Perfusion Imaging Study.   The test will take approximately 3 to 4 hours to complete; you may bring reading material.  If someone comes with you to your appointment, they will need to remain in the main lobby due to limited space in the testing area. **If you are pregnant or breastfeeding, please notify the nuclear lab prior to your appointment**  You will need to hold the following medications prior to your stress test: N/A   How to prepare for your Myocardial Perfusion Test:  Do not eat or drink 3 hours prior to your test, except you may have water.  Do not consume products containing caffeine (regular or decaffeinated) 12 hours prior to your test. (ex: coffee, chocolate, sodas, tea).  Do wear comfortable clothes (no dresses or overalls) and walking shoes, tennis shoes preferred (No heels or open toe shoes are allowed).  Do NOT wear cologne, perfume, aftershave, or lotions (deodorant is allowed).  If these instructions are not followed, your test will have to be rescheduled.  Follow-up: Dr Harrell Gave recommends that you follow-up with her as needed.  If you need a refill on your cardiac medications before your next appointment, please call your pharmacy.

## 2017-11-27 ENCOUNTER — Telehealth (HOSPITAL_COMMUNITY): Payer: Self-pay

## 2017-11-27 NOTE — Telephone Encounter (Signed)
Encounter complete. 

## 2017-12-02 ENCOUNTER — Ambulatory Visit (HOSPITAL_COMMUNITY)
Admission: RE | Admit: 2017-12-02 | Discharge: 2017-12-02 | Disposition: A | Discharge status: Home / Self Care | Payer: Medicare Other | Source: Ambulatory Visit | Attending: Cardiovascular Disease | Admitting: Cardiovascular Disease

## 2017-12-02 DIAGNOSIS — R072 Precordial pain: Secondary | ICD-10-CM | POA: Diagnosis not present

## 2017-12-02 LAB — MYOCARDIAL PERFUSION IMAGING
CHL CUP MPHR: 153 {beats}/min
CHL CUP NUCLEAR SSS: 4
CHL CUP RESTING HR STRESS: 48 {beats}/min
CHL RATE OF PERCEIVED EXERTION: 17
CSEPED: 8 min
CSEPEDS: 1 s
CSEPEW: 9 METS
CSEPHR: 95 %
LV dias vol: 134 mL (ref 62–150)
LV sys vol: 62 mL
NUC STRESS TID: 0.92
Peak HR: 146 {beats}/min
SDS: 2
SRS: 2

## 2017-12-02 MED ORDER — TECHNETIUM TC 99M TETROFOSMIN IV KIT
29.1000 | PACK | Freq: Once | INTRAVENOUS | Status: AC | PRN
  Administered 2017-12-02: 29.1 via INTRAVENOUS
  Filled 2017-12-02: qty 30

## 2017-12-02 MED ORDER — TECHNETIUM TC 99M TETROFOSMIN IV KIT
10.2000 | PACK | Freq: Once | INTRAVENOUS | Status: AC | PRN
  Administered 2017-12-02: 10.2 via INTRAVENOUS
  Filled 2017-12-02: qty 11

## 2018-01-23 DIAGNOSIS — K2930 Chronic superficial gastritis without bleeding: Secondary | ICD-10-CM | POA: Diagnosis not present

## 2018-01-23 DIAGNOSIS — K2900 Acute gastritis without bleeding: Secondary | ICD-10-CM | POA: Diagnosis not present

## 2018-01-23 DIAGNOSIS — K219 Gastro-esophageal reflux disease without esophagitis: Secondary | ICD-10-CM | POA: Diagnosis not present

## 2018-01-23 DIAGNOSIS — R12 Heartburn: Secondary | ICD-10-CM | POA: Diagnosis not present

## 2018-01-23 DIAGNOSIS — K317 Polyp of stomach and duodenum: Secondary | ICD-10-CM | POA: Diagnosis not present

## 2018-01-27 DIAGNOSIS — K2930 Chronic superficial gastritis without bleeding: Secondary | ICD-10-CM | POA: Diagnosis not present

## 2018-02-18 DIAGNOSIS — Z13220 Encounter for screening for lipoid disorders: Secondary | ICD-10-CM | POA: Diagnosis not present

## 2018-02-18 DIAGNOSIS — K635 Polyp of colon: Secondary | ICD-10-CM | POA: Diagnosis not present

## 2018-02-18 DIAGNOSIS — Z0000 Encounter for general adult medical examination without abnormal findings: Secondary | ICD-10-CM | POA: Diagnosis not present

## 2018-02-18 DIAGNOSIS — R159 Full incontinence of feces: Secondary | ICD-10-CM | POA: Diagnosis not present

## 2018-02-18 DIAGNOSIS — R972 Elevated prostate specific antigen [PSA]: Secondary | ICD-10-CM | POA: Diagnosis not present

## 2018-02-18 DIAGNOSIS — Z136 Encounter for screening for cardiovascular disorders: Secondary | ICD-10-CM | POA: Diagnosis not present

## 2018-02-18 DIAGNOSIS — Z23 Encounter for immunization: Secondary | ICD-10-CM | POA: Diagnosis not present

## 2018-02-18 DIAGNOSIS — M25571 Pain in right ankle and joints of right foot: Secondary | ICD-10-CM | POA: Diagnosis not present

## 2018-02-19 DIAGNOSIS — Z0000 Encounter for general adult medical examination without abnormal findings: Secondary | ICD-10-CM | POA: Diagnosis not present

## 2018-02-19 DIAGNOSIS — R972 Elevated prostate specific antigen [PSA]: Secondary | ICD-10-CM | POA: Diagnosis not present

## 2018-02-19 DIAGNOSIS — K635 Polyp of colon: Secondary | ICD-10-CM | POA: Diagnosis not present

## 2018-02-19 DIAGNOSIS — Z23 Encounter for immunization: Secondary | ICD-10-CM | POA: Diagnosis not present

## 2018-02-19 DIAGNOSIS — M25571 Pain in right ankle and joints of right foot: Secondary | ICD-10-CM | POA: Diagnosis not present

## 2018-03-12 DIAGNOSIS — R140 Abdominal distension (gaseous): Secondary | ICD-10-CM | POA: Diagnosis not present

## 2018-03-12 DIAGNOSIS — R195 Other fecal abnormalities: Secondary | ICD-10-CM | POA: Diagnosis not present

## 2018-03-12 DIAGNOSIS — K219 Gastro-esophageal reflux disease without esophagitis: Secondary | ICD-10-CM | POA: Diagnosis not present

## 2018-03-20 DIAGNOSIS — K635 Polyp of colon: Secondary | ICD-10-CM | POA: Diagnosis not present

## 2018-03-20 DIAGNOSIS — K640 First degree hemorrhoids: Secondary | ICD-10-CM | POA: Diagnosis not present

## 2018-03-20 DIAGNOSIS — R195 Other fecal abnormalities: Secondary | ICD-10-CM | POA: Diagnosis not present

## 2018-03-20 DIAGNOSIS — K5730 Diverticulosis of large intestine without perforation or abscess without bleeding: Secondary | ICD-10-CM | POA: Diagnosis not present

## 2018-03-24 DIAGNOSIS — K635 Polyp of colon: Secondary | ICD-10-CM | POA: Diagnosis not present

## 2018-09-09 DIAGNOSIS — C44529 Squamous cell carcinoma of skin of other part of trunk: Secondary | ICD-10-CM | POA: Diagnosis not present

## 2018-09-09 DIAGNOSIS — C44629 Squamous cell carcinoma of skin of left upper limb, including shoulder: Secondary | ICD-10-CM | POA: Diagnosis not present

## 2018-10-20 DIAGNOSIS — J029 Acute pharyngitis, unspecified: Secondary | ICD-10-CM | POA: Diagnosis not present

## 2018-10-21 DIAGNOSIS — Z08 Encounter for follow-up examination after completed treatment for malignant neoplasm: Secondary | ICD-10-CM | POA: Diagnosis not present

## 2018-10-21 DIAGNOSIS — X32XXXD Exposure to sunlight, subsequent encounter: Secondary | ICD-10-CM | POA: Diagnosis not present

## 2018-10-21 DIAGNOSIS — L570 Actinic keratosis: Secondary | ICD-10-CM | POA: Diagnosis not present

## 2018-10-21 DIAGNOSIS — Z85828 Personal history of other malignant neoplasm of skin: Secondary | ICD-10-CM | POA: Diagnosis not present

## 2018-10-26 ENCOUNTER — Encounter (HOSPITAL_COMMUNITY): Payer: Self-pay | Admitting: Emergency Medicine

## 2018-10-26 ENCOUNTER — Emergency Department (HOSPITAL_COMMUNITY)
Admission: EM | Admit: 2018-10-26 | Discharge: 2018-10-26 | Disposition: A | Discharge status: Home / Self Care | Payer: Medicare Other | Attending: Emergency Medicine | Admitting: Emergency Medicine

## 2018-10-26 ENCOUNTER — Other Ambulatory Visit: Payer: Self-pay

## 2018-10-26 DIAGNOSIS — M542 Cervicalgia: Secondary | ICD-10-CM | POA: Diagnosis not present

## 2018-10-26 DIAGNOSIS — R030 Elevated blood-pressure reading, without diagnosis of hypertension: Secondary | ICD-10-CM

## 2018-10-26 DIAGNOSIS — Z79899 Other long term (current) drug therapy: Secondary | ICD-10-CM | POA: Insufficient documentation

## 2018-10-26 DIAGNOSIS — M436 Torticollis: Secondary | ICD-10-CM | POA: Diagnosis not present

## 2018-10-26 MED ORDER — DIAZEPAM 5 MG PO TABS: 5.0000 mg | ORAL_TABLET | Freq: Two times a day (BID) | ORAL | 0 refills | Status: DC | PRN

## 2018-10-26 NOTE — Discharge Instructions (Addendum)
Apply ice for thirty minutes at a time, four times a day, as needed.  Continue using ibuprofen and cyclobenzaprine as needed.  If you continue having spasms, try taking the diazepam.  Try wearing a foam neck brace at night.  Your blood pressure was high today. Please have it checked several times over the next two weeks. If it is staying high, then you may need to start taking medication to bring it down. Untreated high blood pressure can lead to heart attacks, strokes, kidney failure.

## 2018-10-26 NOTE — ED Triage Notes (Addendum)
Patient here from home with complaints of bilateral side neck pain that started 5 days ago. Increasingly worse. Stiff unable to turn neck. Ibuprofen and muscle relaxants with no relief.
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
## 2019-01-26 DIAGNOSIS — Z23 Encounter for immunization: Secondary | ICD-10-CM | POA: Diagnosis not present

## 2019-02-10 DIAGNOSIS — H8112 Benign paroxysmal vertigo, left ear: Secondary | ICD-10-CM | POA: Diagnosis not present

## 2019-02-15 DIAGNOSIS — H8112 Benign paroxysmal vertigo, left ear: Secondary | ICD-10-CM | POA: Diagnosis not present

## 2019-03-05 HISTORY — PX: PROSTATE BIOPSY: SHX241

## 2019-04-15 DIAGNOSIS — H2513 Age-related nuclear cataract, bilateral: Secondary | ICD-10-CM | POA: Diagnosis not present

## 2019-04-19 DIAGNOSIS — Z23 Encounter for immunization: Secondary | ICD-10-CM | POA: Diagnosis not present

## 2019-05-10 DIAGNOSIS — Z23 Encounter for immunization: Secondary | ICD-10-CM | POA: Diagnosis not present

## 2019-05-28 DIAGNOSIS — Z0000 Encounter for general adult medical examination without abnormal findings: Secondary | ICD-10-CM | POA: Diagnosis not present

## 2019-05-28 DIAGNOSIS — N529 Male erectile dysfunction, unspecified: Secondary | ICD-10-CM | POA: Diagnosis not present

## 2019-05-28 DIAGNOSIS — G479 Sleep disorder, unspecified: Secondary | ICD-10-CM | POA: Diagnosis not present

## 2019-05-28 DIAGNOSIS — F341 Dysthymic disorder: Secondary | ICD-10-CM | POA: Diagnosis not present

## 2019-05-28 DIAGNOSIS — Z125 Encounter for screening for malignant neoplasm of prostate: Secondary | ICD-10-CM | POA: Diagnosis not present

## 2019-05-28 DIAGNOSIS — M2550 Pain in unspecified joint: Secondary | ICD-10-CM | POA: Diagnosis not present

## 2019-06-15 DIAGNOSIS — X32XXXD Exposure to sunlight, subsequent encounter: Secondary | ICD-10-CM | POA: Diagnosis not present

## 2019-06-15 DIAGNOSIS — L820 Inflamed seborrheic keratosis: Secondary | ICD-10-CM | POA: Diagnosis not present

## 2019-06-15 DIAGNOSIS — D225 Melanocytic nevi of trunk: Secondary | ICD-10-CM | POA: Diagnosis not present

## 2019-06-15 DIAGNOSIS — L570 Actinic keratosis: Secondary | ICD-10-CM | POA: Diagnosis not present

## 2019-06-15 DIAGNOSIS — L905 Scar conditions and fibrosis of skin: Secondary | ICD-10-CM | POA: Diagnosis not present

## 2019-06-22 ENCOUNTER — Other Ambulatory Visit: Payer: Self-pay | Admitting: Family Medicine

## 2019-06-22 DIAGNOSIS — R0981 Nasal congestion: Secondary | ICD-10-CM | POA: Diagnosis not present

## 2019-06-22 DIAGNOSIS — H8110 Benign paroxysmal vertigo, unspecified ear: Secondary | ICD-10-CM | POA: Diagnosis not present

## 2019-06-22 DIAGNOSIS — K649 Unspecified hemorrhoids: Secondary | ICD-10-CM | POA: Diagnosis not present

## 2019-06-22 DIAGNOSIS — R55 Syncope and collapse: Secondary | ICD-10-CM | POA: Diagnosis not present

## 2019-07-06 ENCOUNTER — Ambulatory Visit
Admission: RE | Admit: 2019-07-06 | Discharge: 2019-07-06 | Disposition: A | Discharge status: Home / Self Care | Payer: Medicare Other | Source: Ambulatory Visit | Attending: Family Medicine | Admitting: Family Medicine

## 2019-07-06 ENCOUNTER — Other Ambulatory Visit: Payer: Self-pay

## 2019-07-06 DIAGNOSIS — R0981 Nasal congestion: Secondary | ICD-10-CM

## 2019-07-06 DIAGNOSIS — J3489 Other specified disorders of nose and nasal sinuses: Secondary | ICD-10-CM | POA: Diagnosis not present

## 2019-07-12 ENCOUNTER — Other Ambulatory Visit: Payer: Self-pay

## 2019-07-12 ENCOUNTER — Ambulatory Visit: Payer: Medicare Other | Admitting: Cardiology

## 2019-07-12 ENCOUNTER — Encounter: Payer: Self-pay | Admitting: Cardiology

## 2019-07-12 VITALS — BP 157/87 | HR 42 | Ht 73.0 in | Wt 176.0 lb

## 2019-07-12 DIAGNOSIS — R030 Elevated blood-pressure reading, without diagnosis of hypertension: Secondary | ICD-10-CM

## 2019-07-12 DIAGNOSIS — R001 Bradycardia, unspecified: Secondary | ICD-10-CM

## 2019-07-12 DIAGNOSIS — R55 Syncope and collapse: Secondary | ICD-10-CM

## 2019-07-12 DIAGNOSIS — I493 Ventricular premature depolarization: Secondary | ICD-10-CM | POA: Diagnosis not present

## 2019-07-12 NOTE — Progress Notes (Signed)
Date:  07/12/2019   ID:  Calvin Howe, DOB [birth year], MRN [id number]  PCP:  Aura Dials, MD  Cardiologist:  Rex Kras, DO, [facility] (established care 07/12/2019)  REASON FOR CONSULT: Recurring episodes of pre-syncope  REQUESTING PHYSICIAN:  Aura Dials, MD [place],  [place] [address]  Chief Complaint  Patient presents with  . Near Syncope  . New Patient (Initial Visit)    HPI  Calvin Howe is a 69 y.o. male who is being seen today for the evaluation of near syncope at the request of Aura Dials, MD. Patient's past medical history and cardiac risk factors include: BPPV, PVC, near syncope, advanced age.  Patient is referred to the office at the request of his primary care provider for evaluation of near syncope.  Patient states that the symptoms have been going on for the last 6 months.  He has a history of BPPV and states that the symptoms are different.  Patient states that the symptoms of near syncope occur randomly.  More prominent when he is standing still or resting.  He feels like he is about to faint but has never lost consciousness.  It is more noticeable during stressful situations.  He sometimes tries to look down which helps alleviate his symptoms at times.  Not associated with prolonged standing.  Has not seen ENT and/or neurology.  Patient had a nuclear stress test back in 2019 report noted below.  He is also had a cardiac event monitor for 30 days in the past summarized findings noted below.  Denies prior history of coronary artery disease, myocardial infarction, congestive heart failure, deep venous thrombosis, pulmonary embolism, stroke, transient ischemic attack.  FUNCTIONAL STATUS: No structured exercise or daily routine.  But active with doing wood working on a new house and able to go up several flights of stairs without any effort related symptoms.   ALLERGIES: No Known Allergies  MEDICATION LIST PRIOR TO VISIT: Current Meds    Medication Sig  . Ascorbic Acid (VITAMIN C) 1000 MG tablet Take 1,000 mg by mouth daily.  Marland Kitchen buPROPion (WELLBUTRIN XL) 300 MG 24 hr tablet Take 300 mg by mouth every morning.  . Cholecalciferol (D3-1000) 25 MCG (1000 UT) capsule Take 1,000 Units by mouth daily.  Marland Kitchen ibuprofen (ADVIL) 200 MG tablet Take 200 mg by mouth every 6 (six) hours as needed.     PAST MEDICAL HISTORY: Past Medical History:  Diagnosis Date  . Arthritis   . Carpal tunnel syndrome   . Eczema   . Epididymal cyst    right  . History of hydrocele   . Right carpal tunnel syndrome 06/26/2015  . Sinus infection    STARTED ANTIBIOTIC 12-01-2014  . Wears glasses     PAST SURGICAL HISTORY: Past Surgical History:  Procedure Laterality Date  . COLONOSCOPY WITH PROPOFOL  Aug 2016  . EPIDIDYMECTOMY Right 12/12/2014   Procedure: EPIDIDYMAL CYST EXCISION;  Surgeon: Cleon Gustin, MD;  Location: [facility];  Service: Urology;  Laterality: Right;  . HYDROCELE EXCISION Right 06-01-2007    FAMILY HISTORY: The patient family history includes Arthritis in his mother; Cerebral palsy in his sister; Heart disease in his father and mother; Multiple myeloma in his father; Supraventricular tachycardia in his mother.  SOCIAL HISTORY:  The patient  reports that he has never smoked. He has never used smokeless tobacco. He reports current alcohol use. He reports that he does not use drugs.  REVIEW OF SYSTEMS: Review  of Systems  Constitution: Negative for chills and fever.  HENT: Negative for hoarse voice and nosebleeds.   Eyes: Negative for discharge, double vision and pain.  Cardiovascular: Positive for near-syncope. Negative for chest pain, claudication, dyspnea on exertion, leg swelling, orthopnea, palpitations, paroxysmal nocturnal dyspnea and syncope.  Respiratory: Negative for hemoptysis and shortness of breath.   Musculoskeletal: Negative for muscle cramps and myalgias.  Gastrointestinal: Negative for  abdominal pain, constipation, diarrhea, hematemesis, hematochezia, melena, nausea and vomiting.  Neurological: Positive for dizziness, light-headedness and vertigo.    PHYSICAL EXAM: Vitals with BMI 07/12/2019 10/26/2018 10/26/2018  Height 6' 1"  - 6' 1"   Weight 176 lbs - 168 lbs  BMI 56.21 - 30.86  Systolic 578 469 629  Diastolic 87 87 88  Pulse 42 - 79   Orthostatic VS for the past 72 hrs (Last 3 readings):  Orthostatic BP Patient Position BP Location Cuff Size Orthostatic Pulse  07/12/19 1031 140/86 Standing Left Arm Large 58  07/12/19 1030 159/79 Sitting Left Arm Large 54  07/12/19 1029 149/81 Supine Left Arm Large 51   CONSTITUTIONAL: Well-developed and well-nourished. No acute distress.  SKIN: Skin is warm and dry. No rash noted. No cyanosis. No pallor. No jaundice HEAD: Normocephalic and atraumatic.  EYES: No scleral icterus MOUTH/THROAT: Moist oral membranes.  NECK: No JVD present. No thyromegaly noted. No carotid bruits  LYMPHATIC: No visible cervical adenopathy.  CHEST Normal respiratory effort. No intercostal retractions  LUNGS: Clear to auscultation bilaterally.  No stridor. No wheezes. No rales.  CARDIOVASCULAR: Regular rate and rhythm, positive S1-S2, no murmurs rubs or gallops appreciated ABDOMINAL: No apparent ascites.  EXTREMITIES: No peripheral edema  HEMATOLOGIC: No significant bruising NEUROLOGIC: Oriented to person, place, and time. Nonfocal. Normal muscle tone.  PSYCHIATRIC: Normal mood and affect. Normal behavior. Cooperative  CARDIAC DATABASE: EKG: 07/12/2019: Sinus  Bradycardia, 50bpm, Low voltage in precordial leads, incomplete right bundle branch block.   Echocardiogram: None  Stress Testing: 12/2017:  The left ventricular ejection fraction is mildly decreased (45-54%). Nuclear stress EF: 54%. Blood pressure demonstrated a normal response to exercise. There was no ST segment deviation noted during stress. Defect 1: There is a small defect of mild  severity present in the apex location This is a low risk study. Low risk, probably normal stress nuclear study with mild apical thinning.  No ischemia.  Gated ejection fraction 54% with normal wall motion.  Note frequent PVCs with exercise and in recovery.  Couplets and 3 beats nonsustained ventricular tachycardia also noted in recovery.  Heart Catheterization: None  Carotid duplex: None  30 day cardiac event monitor: 01/2016: 1. NSR with sinus tachycardia and sinus bradycardia 2. Occaisional PAC's and PVC's 3. Brief episodes of NS atrial tachycardia (4-6 beats) 4. No pauses or asystole 5. Noise artifact  LABORATORY DATA: Labs from 05/28/2019 reviewed. Creatinine 1.09 mg/dL. eGFR: 80 mL/min per 1.73 m Lipid profile: Total cholesterol 156, triglycerides 79, HDL 45, LDL 96, non-HDL 111  IMPRESSION:    ICD-10-CM   1. Near syncope  R55 EKG 12-Lead    PCV CAROTID DUPLEX (BILATERAL)    HOLTER MONITOR - 24 HOUR    PCV ECHOCARDIOGRAM COMPLETE    TSH  2. PVC's (premature ventricular contractions)  I49.3 HOLTER MONITOR - 24 HOUR  3. Sinus bradycardia  R00.1   4. Elevated blood pressure reading without diagnosis of hypertension  R03.0      RECOMMENDATIONS: Jaishaun Mcnab is a 69 y.o. male whose past medical history and cardiac risk factors  include: BPPV, PVC, near syncope, advanced age.  Near-syncope:  EKG shows sinus bradycardia without any dysrhythmias.  In the past patient is noted to have premature ventricular contractions and also EKG that was sent to the office from primary care does show rare PVCs.  Patient is mildly orthostatic.  Patient states that his fluid intake is not adequate enough.  Not on any antihypertensive medications  No reversible causes identified  We will check TSH  Carotid duplex to evaluate for carotid artery atherosclerosis.  24-hour Holter monitor to evaluate for PVC burden, atrial fibrillation, and other dysrhythmias.  Echocardiogram will  be ordered to evaluate for structural heart disease and left ventricular systolic function.  Sinus bradycardia: Continue to monitor  PVCs: Check 24-hour Holter.  Elevated blood pressures without history of hypertension: Patient is asked to keep a log of his blood pressures and to review it with his primary care provider at the next office visit and consider initiation of pharmacological therapy if clinically warranted.  FINAL MEDICATION LIST END OF ENCOUNTER: No orders of the defined types were placed in this encounter.   Medications Discontinued During This Encounter  Medication Reason  . diazepam (VALIUM) 5 MG tablet Completed Course  . ibuprofen (ADVIL,MOTRIN) 800 MG tablet Completed Course     Current Outpatient Medications:  .  Ascorbic Acid (VITAMIN C) 1000 MG tablet, Take 1,000 mg by mouth daily., Disp: , Rfl:  .  buPROPion (WELLBUTRIN XL) 300 MG 24 hr tablet, Take 300 mg by mouth every morning., Disp: , Rfl:  .  Cholecalciferol (D3-1000) 25 MCG (1000 UT) capsule, Take 1,000 Units by mouth daily., Disp: , Rfl:  .  ibuprofen (ADVIL) 200 MG tablet, Take 200 mg by mouth every 6 (six) hours as needed., Disp: , Rfl:   Orders Placed This Encounter  Procedures  . TSH  . HOLTER MONITOR - 24 HOUR  . EKG 12-Lead  . PCV ECHOCARDIOGRAM COMPLETE  . PCV CAROTID DUPLEX (BILATERAL)   --Continue cardiac medications as reconciled in final medication list. --Return in about 6 weeks (around 08/23/2019) for re-evaluation of symptoms., review test results.. Or sooner if needed. --Continue follow-up with your primary care physician regarding the management of your other chronic comorbid conditions.  Patient's questions and concerns were addressed to his satisfaction. He voices understanding of the instructions provided during this encounter.   This note was created using a voice recognition software as a result there may be grammatical errors inadvertently enclosed that do not reflect the nature  of this encounter. Every attempt is made to correct such errors.  Rex Kras, [place], [facility]  Pager: [phone] Office: [phone]

## 2019-07-13 LAB — TSH: TSH: 1.31 u[IU]/mL (ref 0.450–4.500)

## 2019-07-16 ENCOUNTER — Other Ambulatory Visit: Payer: Self-pay

## 2019-07-16 ENCOUNTER — Ambulatory Visit: Payer: Medicare Other

## 2019-07-16 DIAGNOSIS — R55 Syncope and collapse: Secondary | ICD-10-CM

## 2019-07-16 DIAGNOSIS — I493 Ventricular premature depolarization: Secondary | ICD-10-CM

## 2019-07-16 DIAGNOSIS — R972 Elevated prostate specific antigen [PSA]: Secondary | ICD-10-CM | POA: Diagnosis not present

## 2019-07-23 ENCOUNTER — Telehealth: Payer: Self-pay

## 2019-07-23 NOTE — Telephone Encounter (Signed)
Patient called about monitor results. He stated that he turned it in last Saturday. Please advise.   If you want him to wait until his FU appt on the 21th of June, he said he can, but if it is urgent, he wants a call sooner. I did advise him that you would be in Monday and was fine with waiting.

## 2019-07-23 NOTE — Telephone Encounter (Signed)
Patient called about monitor results. He stated that he turned it in last Saturday. Please advise.   If you want him to wait until his FU appt on the 21th of June, he said hecan, but if it is urgent, he wants a call sooner. I did advise him that you would be in Monday

## 2019-07-27 NOTE — Telephone Encounter (Signed)
Please notify him that we will reach out with the results.

## 2019-08-23 ENCOUNTER — Other Ambulatory Visit: Payer: Self-pay

## 2019-08-23 ENCOUNTER — Ambulatory Visit: Payer: Medicare Other | Admitting: Cardiology

## 2019-08-23 ENCOUNTER — Encounter: Payer: Self-pay | Admitting: Cardiology

## 2019-08-23 VITALS — BP 138/84 | HR 56 | Resp 15 | Ht 73.0 in | Wt 175.6 lb

## 2019-08-23 DIAGNOSIS — Z712 Person consulting for explanation of examination or test findings: Secondary | ICD-10-CM

## 2019-08-23 DIAGNOSIS — I493 Ventricular premature depolarization: Secondary | ICD-10-CM

## 2019-08-23 DIAGNOSIS — R001 Bradycardia, unspecified: Secondary | ICD-10-CM

## 2019-08-23 DIAGNOSIS — R55 Syncope and collapse: Secondary | ICD-10-CM

## 2019-08-23 NOTE — Progress Notes (Addendum)
Date:  08/23/2019   ID:  Calvin Howe, DOB 21-Oct-1950, MRN [id number]  PCP:  Aura Dials, MD  Cardiologist:  Rex Kras, DO, [facility] (established care 07/12/2019)  Date: 08/23/19 Last Office Visit: 07/12/2019  Chief Complaint  Patient presents with  . Follow-up    6 week  . Results    HPI  Calvin Howe is a 69 y.o. male who presents to the office with a  chief complaint of " review test results and reevaluate symptoms of near syncope."  His past medical history and cardiovascular risk factors are: BPPV, PVC, near syncope, advanced age.   Patient was originally referred to the office for evaluation of recurring episodes of near syncope.   At the last office visit, patient states that the symptoms have been going on for the last 6 months.  He has a history of BPPV and states that the symptoms are different.  Patient states that the symptoms of near syncope occur randomly.  More prominent when he is standing still or resting.  He feels like he is about to faint but has never lost consciousness.  It is more noticeable during stressful situations.  He sometimes tries to look down which helps alleviate his symptoms at times.  Not associated with prolonged standing.  Has not seen ENT and/or neurology.  Since last office visit his syncope or syncope have resolved and not resurfaced.  Results of the most recent echocardiogram, carotid duplex and 24-hour Holter monitor are reviewed wit him in detail at today's visit and noted below for further reference.  Denies prior history of coronary artery disease, myocardial infarction, congestive heart failure, deep venous thrombosis, pulmonary embolism, stroke, transient ischemic attack.  FUNCTIONAL STATUS: No structured exercise or daily routine.  But active with doing wood working on a new house and able to go up several flights of stairs without any effort related symptoms.   ALLERGIES: No Known Allergies  MEDICATION LIST PRIOR TO  VISIT: Current Meds  Medication Sig  . Ascorbic Acid (VITAMIN C) 1000 MG tablet Take 1,000 mg by mouth daily.  Marland Kitchen buPROPion (WELLBUTRIN XL) 300 MG 24 hr tablet Take 300 mg by mouth every morning.  . Cholecalciferol (D3-1000) 25 MCG (1000 UT) capsule Take 1,000 Units by mouth daily.  Marland Kitchen ibuprofen (ADVIL) 200 MG tablet Take 200 mg by mouth every 6 (six) hours as needed.     PAST MEDICAL HISTORY: Past Medical History:  Diagnosis Date  . Arthritis   . Carpal tunnel syndrome   . Eczema   . Epididymal cyst    right  . History of hydrocele   . Right carpal tunnel syndrome 06/26/2015  . Sinus infection    STARTED ANTIBIOTIC 12-01-2014  . Wears glasses     PAST SURGICAL HISTORY: Past Surgical History:  Procedure Laterality Date  . COLONOSCOPY WITH PROPOFOL  Aug 2016  . EPIDIDYMECTOMY Right 12/12/2014   Procedure: EPIDIDYMAL CYST EXCISION;  Surgeon: Cleon Gustin, MD;  Location: [facility];  Service: Urology;  Laterality: Right;  . HYDROCELE EXCISION Right 06-01-2007    FAMILY HISTORY: The patient family history includes Arthritis in his mother; Cerebral palsy in his sister; Heart disease in his father and mother; Multiple myeloma in his father; Supraventricular tachycardia in his mother.  SOCIAL HISTORY:  The patient  reports that he has never smoked. He has never used smokeless tobacco. He reports current alcohol use. He reports that he does not use drugs.  REVIEW OF SYSTEMS: Review of  Systems  Constitutional: Negative for chills and fever.  HENT: Negative for hoarse voice and nosebleeds.   Eyes: Negative for discharge, double vision and pain.  Cardiovascular: Negative for chest pain, claudication, dyspnea on exertion, leg swelling, near-syncope, orthopnea, palpitations, paroxysmal nocturnal dyspnea and syncope.  Respiratory: Negative for hemoptysis and shortness of breath.   Musculoskeletal: Negative for muscle cramps and myalgias.  Gastrointestinal: Negative  for abdominal pain, constipation, diarrhea, hematemesis, hematochezia, melena, nausea and vomiting.  Neurological: Negative for dizziness, light-headedness and vertigo.    PHYSICAL EXAM: Vitals with BMI 08/23/2019 07/12/2019 10/26/2018  Height 6' 1"  6' 1"  -  Weight 175 lbs 10 oz 176 lbs -  BMI 31.49 70.26 -  Systolic 378 588 502  Diastolic 84 87 87  Pulse 56 42 -   Orthostatic VS for the past 72 hrs (Last 3 readings):  Patient Position BP Location Cuff Size  08/23/19 1151 Sitting Left Arm Normal   CONSTITUTIONAL: Well-developed and well-nourished. No acute distress.  SKIN: Skin is warm and dry. No rash noted. No cyanosis. No pallor. No jaundice HEAD: Normocephalic and atraumatic.  EYES: No scleral icterus MOUTH/THROAT: Moist oral membranes.  NECK: No JVD present. No thyromegaly noted. No carotid bruits  LYMPHATIC: No visible cervical adenopathy.  CHEST Normal respiratory effort. No intercostal retractions  LUNGS: Clear to auscultation bilaterally.  No stridor. No wheezes. No rales.  CARDIOVASCULAR: Regular rate and rhythm, positive D7-A1, soft systolic murmur at the apex, no gallops appreciated ABDOMINAL: No apparent ascites.  EXTREMITIES: No peripheral edema  HEMATOLOGIC: No significant bruising NEUROLOGIC: Oriented to person, place, and time. Nonfocal. Normal muscle tone.  PSYCHIATRIC: Normal mood and affect. Normal behavior. Cooperative  CARDIAC DATABASE: EKG: 07/12/2019: Sinus  Bradycardia, 50bpm, Low voltage in precordial leads, incomplete right bundle branch block.   Echocardiogram: 07/16/2019: LVEF 28%, grade 1 diastolic impairment, normal LAP, mild to moderate MR, mild TR.  Stress Testing: 12/2017:  The left ventricular ejection fraction is mildly decreased (45-54%). Nuclear stress EF: 54%. Blood pressure demonstrated a normal response to exercise. There was no ST segment deviation noted during stress. Defect 1: There is a small defect of mild severity present in the  apex location This is a low risk study. Low risk, probably normal stress nuclear study with mild apical thinning.  No ischemia.  Gated ejection fraction 54% with normal wall motion.  Note frequent PVCs with exercise and in recovery.  Couplets and 3 beats nonsustained ventricular tachycardia also noted in recovery.  Heart Catheterization: None  Carotid artery duplex 07/16/2019: No hemodynamically significant arterial disease in the internal carotid artery bilaterally.  Mild soft plaque noted in bilateral carotid arteries.  Antegrade right vertebral artery flow. Antegrade left vertebral artery flow.   24 hour Holter monitor: Dominant rhythm sinus bradycardia.  Heart rate 42-133 bpm. Average heart rate 61 bpm.  No atrial fibrillation/atrial flutter/supraventricular tachycardia/high grade AV block, sinus pause greater than or equal to 3 seconds in duration.  Ventricular ectopy beats 2289, with 181 ventricular pairs, 13 ventricular trigeminy, 2 ventricular runs 3 beats in duration, and total ventricular ectopic burden 2.55%.  Supraventricular ectopy beats 856, and total supraventricular ectopic burden 0.95%.  Number of patient triggered events: 1. Underlying rhythm sinus bradycardia without ectopy.  LABORATORY DATA: Labs from 05/28/2019 reviewed. Creatinine 1.09 mg/dL. eGFR: 80 mL/min per 1.73 m Lipid profile: Total cholesterol 156, triglycerides 79, HDL 45, LDL 96, non-HDL 111  IMPRESSION:    ICD-10-CM   1. Near syncope  R55   2. PVC's (  premature ventricular contractions)  I49.3   3. Sinus bradycardia  R00.1   4. Encounter to discuss test results  Z71.2      RECOMMENDATIONS: Thadd Apuzzo is a 69 y.o. male whose past medical history and cardiac risk factors include: BPPV, PVC, near syncope, advanced age.  Near-syncope:  EKG shows sinus bradycardia without any dysrhythmias.  In the past patient is noted to have premature ventricular contractions and also EKG that was sent to  the office from primary care does show rare PVCs.  Holter monitor noted a ventricular ectopic burden of 2.6%.  We discussed possibly starting a beta-blocker or CCB; however, patient would like to hold off on additional pharmacological therapy at this time as his symptoms of near syncope have improved since last office visit. I agree with the plan of care.   Carotid duplex results reviewed with the patient.  Echocardiogram results reviewed with the patient.  Sinus bradycardia: Continue to monitor  PVCs: See above  Elevated blood pressures without history of hypertension: Blood pressure log reviewed.  His systolic blood pressures range between 116 -135 mmHg.  Continue to monitor reevaluate in 3 months.  FINAL MEDICATION LIST END OF ENCOUNTER: No orders of the defined types were placed in this encounter.   There are no discontinued medications.   Current Outpatient Medications:  .  Ascorbic Acid (VITAMIN C) 1000 MG tablet, Take 1,000 mg by mouth daily., Disp: , Rfl:  .  buPROPion (WELLBUTRIN XL) 300 MG 24 hr tablet, Take 300 mg by mouth every morning., Disp: , Rfl:  .  Cholecalciferol (D3-1000) 25 MCG (1000 UT) capsule, Take 1,000 Units by mouth daily., Disp: , Rfl:  .  ibuprofen (ADVIL) 200 MG tablet, Take 200 mg by mouth every 6 (six) hours as needed., Disp: , Rfl:   No orders of the defined types were placed in this encounter.  --Continue cardiac medications as reconciled in final medication list. --Return in about 3 months (around 11/23/2019) for re-evaluation of symptoms near syncope.. Or sooner if needed. --Continue follow-up with your primary care physician regarding the management of your other chronic comorbid conditions.  Patient's questions and concerns were addressed to his satisfaction. He voices understanding of the instructions provided during this encounter.   This note was created using a voice recognition software as a result there may be grammatical errors  inadvertently enclosed that do not reflect the nature of this encounter. Every attempt is made to correct such errors.  Rex Kras, [place], [facility]  Pager: [phone] Office: [phone]

## 2019-10-06 DIAGNOSIS — C61 Malignant neoplasm of prostate: Secondary | ICD-10-CM | POA: Diagnosis not present

## 2019-10-22 DIAGNOSIS — L570 Actinic keratosis: Secondary | ICD-10-CM | POA: Diagnosis not present

## 2019-10-22 DIAGNOSIS — Z08 Encounter for follow-up examination after completed treatment for malignant neoplasm: Secondary | ICD-10-CM | POA: Diagnosis not present

## 2019-10-22 DIAGNOSIS — Z85828 Personal history of other malignant neoplasm of skin: Secondary | ICD-10-CM | POA: Diagnosis not present

## 2019-10-22 DIAGNOSIS — X32XXXD Exposure to sunlight, subsequent encounter: Secondary | ICD-10-CM | POA: Diagnosis not present

## 2019-10-22 DIAGNOSIS — C44629 Squamous cell carcinoma of skin of left upper limb, including shoulder: Secondary | ICD-10-CM | POA: Diagnosis not present

## 2019-10-29 ENCOUNTER — Ambulatory Visit
Admission: RE | Admit: 2019-10-29 | Discharge: 2019-10-29 | Disposition: A | Discharge status: Home / Self Care | Payer: Medicare Other | Source: Ambulatory Visit | Attending: Radiation Oncology | Admitting: Radiation Oncology

## 2019-10-29 ENCOUNTER — Other Ambulatory Visit: Payer: Self-pay

## 2019-10-29 ENCOUNTER — Encounter: Payer: Self-pay | Admitting: Radiation Oncology

## 2019-10-29 VITALS — BP 125/85 | HR 59 | Temp 97.7°F | Resp 18 | Ht 73.0 in | Wt 167.4 lb

## 2019-10-29 DIAGNOSIS — C61 Malignant neoplasm of prostate: Secondary | ICD-10-CM | POA: Insufficient documentation

## 2019-10-29 DIAGNOSIS — N529 Male erectile dysfunction, unspecified: Secondary | ICD-10-CM | POA: Diagnosis not present

## 2019-10-29 DIAGNOSIS — R972 Elevated prostate specific antigen [PSA]: Secondary | ICD-10-CM | POA: Diagnosis not present

## 2019-10-29 HISTORY — DX: Malignant neoplasm of prostate: C61

## 2019-10-29 NOTE — Progress Notes (Signed)
Radiation Oncology         [phone]) [phone] ________________________________  Initial outpatient Consultation  Name: Calvin Howe MRN: [id number]  Date: 10/29/2019  DOB: 08-12-1950  CC:Calvin Dials, MD  Calvin Gallo, MD   REFERRING PHYSICIAN: Franchot Gallo, MD  DIAGNOSIS: 69 y.o. gentleman with Stage T1c adenocarcinoma of the prostate with Gleason score of 3+4, and PSA of 6.3.    ICD-10-CM   1. Malignant neoplasm of prostate ([place])  C61     HISTORY OF PRESENT ILLNESS: Calvin Howe is a 69 y.o. male with a diagnosis of prostate cancer. He was initially referred to Dr. Alyson Howe in 04/2017 for an elevated PSA of 4.3 02/20/2017 and 3.5 on repeat in January 2019.  His digital rectal exam was normal at that time and he was advised to proceed with biopsy but did not follow-up until 07/16/2019 when he was referred back for further elevated PSA at 4.7 in 2020 and 6.3 in March 2021. Digital rectal examination was performed at that time revealing asymmetry with a enlarged left lobe but no discrete nodularity.  The patient proceeded to transrectal ultrasound with 12 biopsies of the prostate on 09/01/2019.  The prostate volume measured 27.19 cc.  Out of 12 core biopsies, 5 were positive.  The maximum Gleason score was 3+4, and this was seen in the left mid and left apex. Additionally, Gleason 3+3 was seen in the left apex, right mid lateral, and right apex lateral.  The patient reviewed the biopsy results with his urologist and he has kindly been referred today for discussion of potential radiation treatment options.   PREVIOUS RADIATION THERAPY: No  PAST MEDICAL HISTORY:  Past Medical History:  Diagnosis Date  . Arthritis   . Carpal tunnel syndrome   . Eczema   . Epididymal cyst    right  . History of hydrocele   . Prostate cancer ([place])   . Right carpal tunnel syndrome 06/26/2015  . Sinus infection    STARTED ANTIBIOTIC 12-01-2014  . Wears glasses       PAST SURGICAL  HISTORY: Past Surgical History:  Procedure Laterality Date  . COLONOSCOPY WITH PROPOFOL  Aug 2016  . EPIDIDYMECTOMY Right 12/12/2014   Procedure: EPIDIDYMAL CYST EXCISION;  Surgeon: Calvin Gustin, MD;  Location: [facility];  Service: Urology;  Laterality: Right;  . HYDROCELE EXCISION Right 06-01-2007  . PROSTATE BIOPSY      FAMILY HISTORY:  Family History  Problem Relation Age of Onset  . Arthritis Mother   . Heart disease Mother   . Supraventricular tachycardia Mother   . Multiple myeloma Father   . Heart disease Father   . Cerebral palsy Sister   . Breast cancer Neg Hx   . Colon cancer Neg Hx   . Pancreatic cancer Neg Hx   . Prostate cancer Neg Hx     SOCIAL HISTORY:  He enjoys playing the banjo and guitar (plays Banjo with Dr. Beryle Howe) Social History   Socioeconomic History  . Marital status: Married    Spouse name: Not on file  . Number of children: 2  . Years of education: Not on file  . Highest education level: Not on file  Occupational History    Comment: retired  Tobacco Use  . Smoking status: Never Smoker  . Smokeless tobacco: Never Used  Vaping Use  . Vaping Use: Never used  Substance and Sexual Activity  . Alcohol use: Yes    Comment: OCCASIONAL  . Drug use: No  .  Sexual activity: Yes  Other Topics Concern  . Not on file  Social History Narrative  . Not on file   Social Determinants of Health   Financial Resource Strain:   . Difficulty of Paying Living Expenses: Not on file  Food Insecurity:   . Worried About Charity fundraiser in the Last Year: Not on file  . Ran Out of Food in the Last Year: Not on file  Transportation Needs:   . Lack of Transportation (Medical): Not on file  . Lack of Transportation (Non-Medical): Not on file  Physical Activity:   . Days of Exercise per Week: Not on file  . Minutes of Exercise per Session: Not on file  Stress:   . Feeling of Stress : Not on file  Social Connections:   .  Frequency of Communication with Friends and Family: Not on file  . Frequency of Social Gatherings with Friends and Family: Not on file  . Attends Religious Services: Not on file  . Active Member of Clubs or Organizations: Not on file  . Attends Archivist Meetings: Not on file  . Marital Status: Not on file  Intimate Partner Violence:   . Fear of Current or Ex-Partner: Not on file  . Emotionally Abused: Not on file  . Physically Abused: Not on file  . Sexually Abused: Not on file    ALLERGIES: Patient has no known allergies.  MEDICATIONS:  Current Outpatient Medications  Medication Sig Dispense Refill  . Ascorbic Acid (VITAMIN C) 1000 MG tablet Take 1,000 mg by mouth daily.    Marland Kitchen buPROPion (WELLBUTRIN XL) 300 MG 24 hr tablet Take 300 mg by mouth every morning.    . Cholecalciferol (D3-1000) 25 MCG (1000 UT) capsule Take 1,000 Units by mouth daily.    Marland Kitchen ibuprofen (ADVIL) 200 MG tablet Take 200 mg by mouth every 6 (six) hours as needed.     No current facility-administered medications for this encounter.    REVIEW OF SYSTEMS:  On review of systems, the patient reports that he is doing well overall. He denies any chest pain, shortness of breath, cough, fevers, chills, night sweats, unintended weight changes. He denies any bowel disturbances, and denies abdominal pain, nausea or vomiting. He denies any new musculoskeletal or joint aches or pains. His IPSS was 3, indicating mild urinary symptoms. His SHIM was 10, indicating he moderate to severe erectile dysfunction. A complete review of systems is obtained and is otherwise negative.    PHYSICAL EXAM:  Wt Readings from Last 3 Encounters:  10/29/19 167 lb 6.4 oz (75.9 kg)  08/23/19 175 lb 9.6 oz (79.7 kg)  07/12/19 176 lb (79.8 kg)   Temp Readings from Last 3 Encounters:  10/29/19 97.7 F (36.5 C)  10/26/18 98.8 F (37.1 C) (Oral)  12/12/14 97.7 F (36.5 C) (Oral)   BP Readings from Last 3 Encounters:  10/29/19 125/85   08/23/19 138/84  07/12/19 (!) 157/87   Pulse Readings from Last 3 Encounters:  10/29/19 (!) 59  08/23/19 (!) 56  07/12/19 (!) 42   Pain Assessment Pain Score: 0-No pain/10  In general this is a well appearing male in no acute distress. He is alert and oriented x4 and appropriate throughout the examination. HEENT reveals that the patient is normocephalic, atraumatic. EOMs are intact. PERRLA. Skin is intact without any evidence of gross lesions. Cardiovascular exam reveals a regular rate and rhythm, no clicks rubs or murmurs are auscultated. Chest is clear to auscultation bilaterally.  Lymphatic assessment is performed and does not reveal any adenopathy in the cervical, supraclavicular, axillary, or inguinal chains. Abdomen has active bowel sounds in all quadrants and is intact. The abdomen is soft, non tender, non distended. Lower extremities are negative for pretibial pitting edema, deep calf tenderness, cyanosis or clubbing.   KPS = 100  100 - Normal; no complaints; no evidence of disease. 90   - Able to carry on normal activity; minor signs or symptoms of disease. 80   - Normal activity with effort; some signs or symptoms of disease. 80   - Cares for self; unable to carry on normal activity or to do active work. 60   - Requires occasional assistance, but is able to care for most of his personal needs. 50   - Requires considerable assistance and frequent medical care. 67   - Disabled; requires special care and assistance. 87   - Severely disabled; hospital admission is indicated although death not imminent. 74   - Very sick; hospital admission necessary; active supportive treatment necessary. 10   - Moribund; fatal processes progressing rapidly. 0     - Dead  Karnofsky DA, Abelmann Correll, Craver LS and Burchenal [facility] [phone]) The use of the nitrogen mustards in the palliative treatment of carcinoma: with particular reference to bronchogenic carcinoma Cancer 1 634-56  LABORATORY DATA:  Lab  Results  Component Value Date   HGB 15.8 12/12/2014   No results found for: NA, K, CL, CO2 No results found for: ALT, AST, GGT, ALKPHOS, BILITOT   RADIOGRAPHY: No results found.    IMPRESSION/PLAN: 1. 69 y.o. gentleman with Stage T1c adenocarcinoma of the prostate with Gleason Score of 3+4, and PSA of 6.3. We discussed the patient's workup and outlined the nature of prostate cancer in this setting. The patient's T stage, Gleason's score, and PSA put him into the favorable intermediate risk group. Accordingly, he is eligible for a variety of potential treatment options including brachytherapy, 5.5 weeks of external radiation, or prostatectomy. We discussed the available radiation techniques, and focused on the details and logistics of delivery. We discussed and outlined the risks, benefits, short and long-term effects associated with radiotherapy and compared and contrasted these with prostatectomy. We discussed the role of SpaceOAR gel in reducing the rectal toxicity associated with radiotherapy. He appears to have a good understanding of his disease and our treatment recommendations which are of curative intent.  He was encouraged to ask questions that were answered to his stated satisfaction.  At the conclusion of our conversation, the patient is interested in moving forward with brachytherapy and use of SpaceOAR gel to reduce rectal toxicity from radiotherapy.  We will share our discussion with Dr. Diona Fanti and move forward with scheduling his [facility] planning appointment in the near future.  The patient met briefly with Romie Jumper in our office who will be working closely with him to coordinate OR scheduling and pre and post procedure appointments.  We will contact the pharmaceutical rep to ensure that [place] is available at the time of procedure.  We enjoyed meeting him today and look forward to continuing to participate in his care.    Nicholos Johns, PA-C    Tyler Pita, MD   [facility] Direct Dial: [phone]  Fax: [phone] [email or web address]  Skype  [email or web address]   This document serves as a record of services personally performed by Tyler Pita, MD and Freeman Caldron, PA-C. It was created on their behalf by Wilburn Mylar,  a trained medical scribe. The creation of this record is based on the scribe's personal observations and the provider's statements to them. This document has been checked and approved by the attending provider.

## 2019-10-29 NOTE — Progress Notes (Signed)
GU Location of Tumor / Histology: prostatic adenocarcinoma  If Prostate Cancer, Gleason Score is (3 + 4) and PSA is (6.3). Prostate volume: 27.19 grams  Calvin Howe's PSA has been trending up since 2018.  2020  psa 4.7 03/2017  psa 3.5 02/2017 psa 4.3 01/2017 psa 3.77   Biopsies of prostate (if applicable) revealed:    Past/Anticipated interventions by urology, if any: prostate biopsy, referral to Dr. Tammi Klippel to discuss radiotherapy  Past/Anticipated interventions by medical oncology, if any: no  Weight changes, if any: denies  Bowel/Bladder complaints, if any: IPSS 3. SHIM 10. Denies dysuria, hematuria, urinary leakage or incontinence.    Nausea/Vomiting, if any: no  Pain issues, if any:  Unchanged intermittent low back pain x years  SAFETY ISSUES:  Prior radiation? denies  Pacemaker/ICD? denies  Possible current pregnancy? no, male patient  Is the patient on methotrexate? no  Current Complaints / other details:  69 year old male. Married. Resides in [place].

## 2019-11-01 DIAGNOSIS — C61 Malignant neoplasm of prostate: Secondary | ICD-10-CM | POA: Insufficient documentation

## 2019-11-02 ENCOUNTER — Telehealth: Payer: Self-pay | Admitting: *Deleted

## 2019-11-02 NOTE — Telephone Encounter (Signed)
CALLED PATIENT TO UPDATE, LVM FOR A RETURN CALL 

## 2019-11-17 ENCOUNTER — Other Ambulatory Visit: Payer: Self-pay | Admitting: Urology

## 2019-11-23 ENCOUNTER — Ambulatory Visit: Payer: Federal, State, Local not specified - PPO | Admitting: Cardiology

## 2019-11-24 ENCOUNTER — Telehealth: Payer: Self-pay | Admitting: *Deleted

## 2019-11-24 NOTE — Telephone Encounter (Signed)
CALLED PATIENT TO INFORM OF IMPLANT DATE, LVM FOR A RETURN CALL 

## 2019-11-30 ENCOUNTER — Ambulatory Visit: Payer: Federal, State, Local not specified - PPO | Admitting: Cardiology

## 2019-12-08 ENCOUNTER — Ambulatory Visit: Payer: Medicare Other | Admitting: Cardiology

## 2019-12-08 ENCOUNTER — Other Ambulatory Visit: Payer: Self-pay

## 2019-12-08 ENCOUNTER — Encounter: Payer: Self-pay | Admitting: Cardiology

## 2019-12-08 ENCOUNTER — Telehealth: Payer: Self-pay | Admitting: *Deleted

## 2019-12-08 VITALS — BP 140/70 | HR 55 | Resp 16 | Ht 73.0 in | Wt 170.0 lb

## 2019-12-08 DIAGNOSIS — R030 Elevated blood-pressure reading, without diagnosis of hypertension: Secondary | ICD-10-CM | POA: Diagnosis not present

## 2019-12-08 DIAGNOSIS — R001 Bradycardia, unspecified: Secondary | ICD-10-CM | POA: Diagnosis not present

## 2019-12-08 DIAGNOSIS — R55 Syncope and collapse: Secondary | ICD-10-CM | POA: Diagnosis not present

## 2019-12-08 DIAGNOSIS — I493 Ventricular premature depolarization: Secondary | ICD-10-CM | POA: Diagnosis not present

## 2019-12-08 NOTE — Telephone Encounter (Signed)
CALLED PATIENT TO REMIND OF PRE-SEED APPTS. FOR 12-09-19, LVM FOR A RETURN CALL

## 2019-12-08 NOTE — Progress Notes (Signed)
ID:  Calvin Howe, DOB 12/03/1950, MRN [id number]  PCP:  Aura Dials, MD  Cardiologist:  Rex Kras, DO, [facility] (established care 07/12/2019)  Date: 12/08/2019 Last Office Visit: 08/23/2019  Chief Complaint  Patient presents with  . Follow-up    3 month near-syncope and PVC management    HPI  Calvin Howe is a 69 y.o. male who presents to the office with a  chief complaint of " 67-monthfollow-up for near syncope and reevaluation of PVC management."  His past medical history and cardiovascular risk factors are: BPPV, PVC, near syncope, advanced age.   Patient was originally referred to the office for evaluation of recurring episodes of near syncope.   Since last office visit patient states that his symptoms of near syncope have essentially resolved.  He also denies any symptoms of palpitations.  He is doing well from a cardiovascular standpoint.  No new chest pain or shortness of breath at rest or with effort related activities.  At the last office visit we discussed that if he has recurrence of symptoms or if his symptoms are persistent would consider pharmacological therapy.  Patient states that he would like to hold off on pharmacological therapy for now as the symptoms have resolved.  His recent work-up included echocardiogram, carotid duplex and 24-hour Holter monitor results noted below for further reference.    Denies prior history of coronary artery disease, myocardial infarction, congestive heart failure, deep venous thrombosis, pulmonary embolism, stroke, transient ischemic attack.  FUNCTIONAL STATUS: No structured exercise or daily routine.  But active with doing wood working on a new house and able to go up several flights of stairs without any effort related symptoms.   ALLERGIES: No Known Allergies  MEDICATION LIST PRIOR TO VISIT: Current Meds  Medication Sig  . buPROPion (WELLBUTRIN XL) 300 MG 24 hr tablet Take 300 mg by mouth every morning.  .Marland Kitchenibuprofen  (ADVIL) 800 MG tablet Take 800 mg by mouth daily as needed.     PAST MEDICAL HISTORY: Past Medical History:  Diagnosis Date  . Arthritis   . Carpal tunnel syndrome   . Eczema   . Epididymal cyst    right  . History of hydrocele   . [place]   . Right carpal tunnel syndrome 06/26/2015  . Sinus infection    STARTED ANTIBIOTIC 12-01-2014  . Wears glasses     PAST SURGICAL HISTORY: Past Surgical History:  Procedure Laterality Date  . COLONOSCOPY WITH PROPOFOL  Aug 2016  . EPIDIDYMECTOMY Right 12/12/2014   Procedure: EPIDIDYMAL CYST EXCISION;  Surgeon: PCleon Gustin MD;  [facility]  Service: Urology;  Laterality: Right;  . HYDROCELE EXCISION Right 06-01-2007  . PROSTATE BIOPSY      FAMILY HISTORY: The patient family history includes Arthritis in his mother; Cerebral palsy in his sister; Heart disease in his father and mother; Multiple myeloma in his father; Supraventricular tachycardia in his mother.  SOCIAL HISTORY:  The patient  reports that he has never smoked. He has never used smokeless tobacco. He reports current alcohol use. He reports that he does not use drugs.  REVIEW OF SYSTEMS: Review of Systems  Constitutional: Negative for chills and fever.  HENT: Negative for hoarse voice and nosebleeds.   Eyes: Negative for discharge, double vision and pain.  Cardiovascular: Negative for chest pain, claudication, dyspnea on exertion, leg swelling, near-syncope, orthopnea, palpitations, paroxysmal nocturnal dyspnea and syncope.  Respiratory: Negative for hemoptysis and shortness of breath.  Musculoskeletal: Negative for muscle cramps and myalgias.  Gastrointestinal: Negative for abdominal pain, constipation, diarrhea, hematemesis, hematochezia, melena, nausea and vomiting.  Neurological: Negative for dizziness, light-headedness and vertigo.    PHYSICAL EXAM: Vitals with BMI 12/08/2019 10/29/2019 10/29/2019  Height 6' 1"  6' 1"  6' 1"     Weight 170 lbs 167 lbs 6 oz 167 lbs 6 oz  BMI 22.43 78.29 56.21  Systolic 308 657 846  Diastolic 70 85 85  Pulse 55 59 59   No data found. CONSTITUTIONAL: Well-developed and well-nourished. No acute distress.  SKIN: Skin is warm and dry. No rash noted. No cyanosis. No pallor. No jaundice HEAD: Normocephalic and atraumatic.  EYES: No scleral icterus MOUTH/THROAT: Moist oral membranes.  NECK: No JVD present. No thyromegaly noted. No carotid bruits  LYMPHATIC: No visible cervical adenopathy.  CHEST Normal respiratory effort. No intercostal retractions  LUNGS: Clear to auscultation bilaterally.  No stridor. No wheezes. No rales.  CARDIOVASCULAR: Regular rate and rhythm, positive N6-E9, soft systolic murmur at the apex, no gallops appreciated ABDOMINAL: No apparent ascites.  EXTREMITIES: No peripheral edema  HEMATOLOGIC: No significant bruising NEUROLOGIC: Oriented to person, place, and time. Nonfocal. Normal muscle tone.  PSYCHIATRIC: Normal mood and affect. Normal behavior. Cooperative  CARDIAC DATABASE: EKG: 07/12/2019: Sinus  Bradycardia, 50bpm, Low voltage in precordial leads, incomplete right bundle branch block.   Echocardiogram: 07/16/2019: LVEF 52%, grade 1 diastolic impairment, normal LAP, mild to moderate MR, mild TR.  Stress Testing: 12/2017:  The left ventricular ejection fraction is mildly decreased (45-54%). Nuclear stress EF: 54%. Blood pressure demonstrated a normal response to exercise. There was no ST segment deviation noted during stress. Defect 1: There is a small defect of mild severity present in the apex location This is a low risk study. Low risk, probably normal stress nuclear study with mild apical thinning.  No ischemia.  Gated ejection fraction 54% with normal wall motion.  Note frequent PVCs with exercise and in recovery.  Couplets and 3 beats nonsustained ventricular tachycardia also noted in recovery.  Heart Catheterization: None  Carotid artery  duplex 07/16/2019: No hemodynamically significant arterial disease in the internal carotid artery bilaterally.  Mild soft plaque noted in bilateral carotid arteries.  Antegrade right vertebral artery flow. Antegrade left vertebral artery flow.   24 hour Holter monitor: Dominant rhythm sinus bradycardia.  Heart rate 42-133 bpm. Average heart rate 61 bpm.  No atrial fibrillation/atrial flutter/supraventricular tachycardia/high grade AV block, sinus pause greater than or equal to 3 seconds in duration.  Ventricular ectopy beats 2289, with 181 ventricular pairs, 13 ventricular trigeminy, 2 ventricular runs 3 beats in duration, and total ventricular ectopic burden 2.55%.  Supraventricular ectopy beats 856, and total supraventricular ectopic burden 0.95%.  Number of patient triggered events: 1. Underlying rhythm sinus bradycardia without ectopy.  LABORATORY DATA: Labs from 05/28/2019 reviewed. Creatinine 1.09 mg/dL. eGFR: 80 mL/min per 1.73 m Lipid profile: Total cholesterol 156, triglycerides 79, HDL 45, LDL 96, non-HDL 111  IMPRESSION:    ICD-10-CM   1. Near syncope  R55   2. PVC's (premature ventricular contractions)  I49.3   3. Sinus bradycardia  R00.1   4. Elevated blood pressure reading without diagnosis of hypertension  R03.0      RECOMMENDATIONS: Calvin Howe is a 69 y.o. male whose past medical history and cardiac risk factors include: BPPV, PVC, near syncope, advanced age.  Near-syncope: Resolved  Since last office visit patient has not had any episodes of near syncope.  No new cardiovascular symptoms.  Shared decision was to proceed with conservative management for now.  Premature ventricular contractions:   Holter monitor noted a total ventricular ectopic burden approximately 2.6%.  Since his symptoms are now well controlled and he does not have any symptoms of palpitations she would like to manage it conservatively.  We did discuss if he were to have symptoms of  palpitations we could consider a low-dose AV nodal blocking agent.  Elevated blood pressures without history of hypertension: Patient states that his SBP  range between 120 -132 mmHg.  No blood pressure log to review at today's office visit.  Patient is asked to keep a log of his blood pressures and to share it with his PCP at his next visit to see if pharmacological therapy is warranted.  He is also been having difficulty sleeping at night with either taking longer time to fall asleep or waking up frequently.  I have asked him to discuss this further with his PCP and may even consider sleep study evaluation.  For now will defer further management to PCP.  FINAL MEDICATION LIST END OF ENCOUNTER: No orders of the defined types were placed in this encounter.    Current Outpatient Medications:  .  buPROPion (WELLBUTRIN XL) 300 MG 24 hr tablet, Take 300 mg by mouth every morning., Disp: , Rfl:  .  ibuprofen (ADVIL) 800 MG tablet, Take 800 mg by mouth daily as needed., Disp: , Rfl:   No orders of the defined types were placed in this encounter.  --Continue cardiac medications as reconciled in final medication list. --Return in about 1 year (around 12/07/2020) for Reevaluation of near syncope, PVCs. . Or sooner if needed. --Continue follow-up with your primary care physician regarding the management of your other chronic comorbid conditions.  Patient's questions and concerns were addressed to his satisfaction. He voices understanding of the instructions provided during this encounter.   This note was created using a voice recognition software as a result there may be grammatical errors inadvertently enclosed that do not reflect the nature of this encounter. Every attempt is made to correct such errors.  Total time spent: 22 minutes.  Rex Kras, [place], [facility]  Pager: [phone] Office: [phone]

## 2019-12-09 ENCOUNTER — Encounter (HOSPITAL_COMMUNITY)
Admission: RE | Admit: 2019-12-09 | Discharge: 2019-12-09 | Disposition: A | Discharge status: Home / Self Care | Payer: Medicare Other | Source: Ambulatory Visit | Attending: Urology | Admitting: Urology

## 2019-12-09 ENCOUNTER — Other Ambulatory Visit: Payer: Self-pay

## 2019-12-09 ENCOUNTER — Ambulatory Visit (HOSPITAL_COMMUNITY)
Admission: RE | Admit: 2019-12-09 | Discharge: 2019-12-09 | Disposition: A | Discharge status: Home / Self Care | Payer: Medicare Other | Source: Ambulatory Visit | Attending: Urology | Admitting: Urology

## 2019-12-09 ENCOUNTER — Ambulatory Visit
Admission: RE | Admit: 2019-12-09 | Discharge: 2019-12-09 | Disposition: A | Discharge status: Home / Self Care | Payer: Medicare Other | Source: Ambulatory Visit | Attending: Radiation Oncology | Admitting: Radiation Oncology

## 2019-12-09 ENCOUNTER — Ambulatory Visit
Admission: RE | Admit: 2019-12-09 | Discharge: 2019-12-09 | Disposition: A | Discharge status: Home / Self Care | Payer: Medicare Other | Source: Ambulatory Visit | Attending: Urology | Admitting: Urology

## 2019-12-09 DIAGNOSIS — Z01818 Encounter for other preprocedural examination: Secondary | ICD-10-CM | POA: Insufficient documentation

## 2019-12-09 DIAGNOSIS — C61 Malignant neoplasm of prostate: Secondary | ICD-10-CM

## 2019-12-13 NOTE — Progress Notes (Signed)
  Radiation Oncology         (336) [phone] ________________________________  Name: Calvin Howe MRN: [id number]  Date: 12/09/2019  DOB: Aug 02, 1950  SIMULATION AND TREATMENT PLANNING NOTE PUBIC ARCH STUDY  [id number], [name], MD  Franchot Gallo, MD  DIAGNOSIS: 69 y.o. gentleman with Stage T1c adenocarcinoma of the prostate with Gleason score of 3+4, and PSA of 6.3  Oncology History   No history exists.      ICD-10-CM   1. Malignant neoplasm of prostate ([place])  C61     COMPLEX SIMULATION:  The patient presented today for evaluation for possible prostate seed implant. He was brought to the radiation planning suite and placed supine on the CT couch. A 3-dimensional image study set was obtained in upload to the planning computer. There, on each axial slice, I contoured the prostate gland. Then, using three-dimensional radiation planning tools I reconstructed the prostate in view of the structures from the transperineal needle pathway to assess for possible pubic arch interference. In doing so, I did not appreciate any pubic arch interference. Also, the patient's prostate volume was estimated based on the drawn structure. The volume was 29 cc.  Given the pubic arch appearance and prostate volume, patient remains a good candidate to proceed with prostate seed implant. Today, he freely provided informed written consent to proceed.    PLAN: The patient will undergo prostate seed implant.   ________________________________  Sheral Apley. Tammi Klippel, M.D.

## 2019-12-15 DIAGNOSIS — R351 Nocturia: Secondary | ICD-10-CM | POA: Diagnosis not present

## 2019-12-15 DIAGNOSIS — C61 Malignant neoplasm of prostate: Secondary | ICD-10-CM | POA: Diagnosis not present

## 2019-12-21 ENCOUNTER — Encounter (HOSPITAL_BASED_OUTPATIENT_CLINIC_OR_DEPARTMENT_OTHER): Payer: Self-pay | Admitting: Urology

## 2019-12-21 ENCOUNTER — Other Ambulatory Visit: Payer: Self-pay

## 2019-12-21 NOTE — Progress Notes (Addendum)
Spoke w/ via phone for pre-op interview---pt Lab needs dos---- none              Lab results------has lab appt 10-28-201 1000 for cbc, cmet, pt, ptt COVID test ------10-28-201 1100 Arrive at -------530 am 01-03-2020 NPO after MN NO Solid Food.  Clear liquids from MN until---430 am then npo Medications to take morning of surgery -----bupropion Diabetic medication -----n/a Patient Special Instructions -----fleets enema am of surgery Pre-Op special Istructions -----none Patient verbalized understanding of instructions that were given at this phone interview. Patient denies shortness of breath, chest pain, fever, cough at this phone interview.  Anesthesia Review:no  PCP:dr robert thacker Cardiologist :dr Shawn Route lov 07-12-2019 epic Chest x-ray :12-09-2019 epic EKG :12-09-2019 epic Echo :07-18-2019 epic Stress test:12-02-2017 epic holter monitor 08-01-2019 epic Carotid dulplex bilateral 07-20-2019 Cardiac Cath : none Activity level: can climb flight of steps without difficulty does house and yard work Sleep Study/ CPAP :none Fasting Blood Sugar :      / Checks Blood Sugar -- times a day:  n/a Blood Thinner/ Instructions /Last Dose:n/a ASA / Instructions/ Last Dose : n/a

## 2019-12-24 DIAGNOSIS — Z23 Encounter for immunization: Secondary | ICD-10-CM | POA: Diagnosis not present

## 2019-12-24 DIAGNOSIS — K429 Umbilical hernia without obstruction or gangrene: Secondary | ICD-10-CM | POA: Diagnosis not present

## 2019-12-24 DIAGNOSIS — R030 Elevated blood-pressure reading, without diagnosis of hypertension: Secondary | ICD-10-CM | POA: Diagnosis not present

## 2019-12-24 DIAGNOSIS — M545 Low back pain, unspecified: Secondary | ICD-10-CM | POA: Diagnosis not present

## 2019-12-27 ENCOUNTER — Telehealth: Payer: Self-pay | Admitting: *Deleted

## 2019-12-27 NOTE — Telephone Encounter (Signed)
CALLED PATIENT TO REMIND OF LAB AND COVID TESTING FOR 12-30-19, LVM FOR A RETURN CALL

## 2019-12-30 ENCOUNTER — Other Ambulatory Visit (HOSPITAL_COMMUNITY)
Admission: RE | Admit: 2019-12-30 | Discharge: 2019-12-30 | Disposition: A | Discharge status: Home / Self Care | Payer: Medicare Other | Source: Ambulatory Visit | Attending: Urology | Admitting: Urology

## 2019-12-30 ENCOUNTER — Other Ambulatory Visit: Payer: Self-pay

## 2019-12-30 ENCOUNTER — Encounter (HOSPITAL_COMMUNITY)
Admission: RE | Admit: 2019-12-30 | Discharge: 2019-12-30 | Disposition: A | Discharge status: Home / Self Care | Payer: Medicare Other | Source: Ambulatory Visit | Attending: Urology | Admitting: Urology

## 2019-12-30 DIAGNOSIS — Z20822 Contact with and (suspected) exposure to covid-19: Secondary | ICD-10-CM | POA: Insufficient documentation

## 2019-12-30 DIAGNOSIS — Z01812 Encounter for preprocedural laboratory examination: Secondary | ICD-10-CM | POA: Diagnosis not present

## 2019-12-30 LAB — COMPREHENSIVE METABOLIC PANEL
ALT: 18 U/L (ref 0–44)
AST: 40 U/L (ref 15–41)
Albumin: 4 g/dL (ref 3.5–5.0)
Alkaline Phosphatase: 74 U/L (ref 38–126)
Anion gap: 9 (ref 5–15)
BUN: 13 mg/dL (ref 8–23)
CO2: 26 mmol/L (ref 22–32)
Calcium: 8.7 mg/dL — ABNORMAL LOW (ref 8.9–10.3)
Chloride: 104 mmol/L (ref 98–111)
Creatinine, Ser: 1.03 mg/dL (ref 0.61–1.24)
GFR, Estimated: >60 mL/min (ref >60)
Glucose, Bld: 89 mg/dL (ref 70–99)
Potassium: 4.4 mmol/L (ref 3.5–5.1)
Sodium: 139 mmol/L (ref 135–145)
Total Bilirubin: 1 mg/dL (ref 0.3–1.2)
Total Protein: 7.1 g/dL (ref 6.5–8.1)

## 2019-12-30 LAB — CBC
HCT: 44.5 % (ref 39.0–52.0)
Hemoglobin: 15 g/dL (ref 13.0–17.0)
MCH: 31.8 pg (ref 26.0–34.0)
MCHC: 33.7 g/dL (ref 30.0–36.0)
MCV: 94.5 fL (ref 80.0–100.0)
Platelets: 181 10*3/uL (ref 150–400)
RBC: 4.71 MIL/uL (ref 4.22–5.81)
RDW: 12.5 % (ref 11.5–15.5)
WBC: 4.1 10*3/uL (ref 4.0–10.5)
nRBC: 0 % (ref 0.0–0.2)

## 2019-12-30 LAB — SARS CORONAVIRUS 2 (TAT 6-24 HRS): SARS Coronavirus 2: NEGATIVE

## 2019-12-30 LAB — PROTIME-INR
INR: 0.9 (ref 0.8–1.2)
Prothrombin Time: 12 seconds (ref 11.4–15.2)

## 2019-12-30 LAB — APTT: aPTT: 44 seconds — ABNORMAL HIGH (ref 24–36)

## 2019-12-31 ENCOUNTER — Telehealth: Payer: Self-pay | Admitting: *Deleted

## 2019-12-31 NOTE — Telephone Encounter (Signed)
CALLED PATIENT TO REMIND OF PROCEDURE FOR 01-03-20, SPOKE WITH PATIENT AND HE IS AWARE OF THIS PROCEDURE

## 2020-01-02 NOTE — Op Note (Signed)
Preoperative diagnosis: Clinical stage TI C adenocarcinoma the prostate   Postoperative diagnosis: Same   Procedure: I-125 prostate seed implantation, SpaceOAR placement, flexible cystoscopy  Surgeon: Nyasha Rahilly M. Nolon Yellin M.D.  Radiation Oncologist: Matthew Manning, M.D.  Anesthesia: Gen.   Indications: Patient  was diagnosed with clinical stage TI C prostate cancer. We had extensive discussion with him about treatment options versus. He elected to proceed with seed implantation. He underwent consultation my office as well as with Dr. Manning. He appeared to understand the advantages disadvantages potential risks of this treatment option. Full informed consent has been obtained.   Technique and findings: Patient was brought the operating room where he had successful induction of general anesthesia. He was placed in dorso-lithotomy position and prepped and draped in usual manner. Appropriate surgical timeout was performed. Radiation oncology department placed a transrectal ultrasound probe anchoring stand. Foley catheter with contrast in the balloon was inserted without difficulty. Anchoring needles were placed within the prostate. Rectal tube was placed. Real-time contouring of the urethra prostate and rectum were performed and the dosing parameters were established. Targeted dose was 145 gray.  I was then called  to the operating suite suite for placement of the needles. A second timeout was performed. All needle passage was done with real-time transrectal ultrasound guidance with the sagittal plane. A total of 21 needles were placed.  68 active seeds were implanted.  I then proceeded with placement of SpaceOAR by introducing a needle with the bevel angled inferiorly approximately 2 cm superior to the anus. This was angled downward and under direct ultrasound was placed within the space between the prostatic capsule and rectum. This was confirmed with a small amount of sterile saline injected and  this was performed under direct ultrasound. I then attached the SpaceOAR to the needle and injected this in the space between the prostate and rectum with good placement noted. The Foley catheter was removed and flexible cystoscopy failed to show any seeds outside the prostate.  The patient was brought to recovery room in stable condition, having tolerated the procedure well..     

## 2020-01-02 NOTE — H&P (Signed)
H&P  Chief Complaint: Prostate cancer  History of Present Illness: Calvin Howe presents for I-125 brachytherapy for treatment of favorable intermediate risk PCa.  6.30.2021: TRUS/Bx for elevated PSA--6.3. Prostate volume 27.2 ml, PSAD 0.23. 5/12 cores positive--  3 cores (Rt mid lateral,5 %, Rt apex lateral, 20%, Rt apex medial, 5%) revealed GS 3+3 pattern   2 cores (Lt apex medial, 10%, Rt mid medial, 50%) revealed GS 3+4 pattern.     Past Medical History:  Diagnosis Date  . Arthritis   . Carpal tunnel syndrome   . Depression   . Eczema   . Epididymal cyst    right  . History of hydrocele   . Lower back pain    in am gets better after moves around  . Near syncope 06/2019 last episode  . Prostate cancer ([place])   . PVC (premature ventricular contraction)   . Right carpal tunnel syndrome 06/26/2015  . Sinus bradycardia   . Wears glasses     Past Surgical History:  Procedure Laterality Date  . COLONOSCOPY WITH PROPOFOL  Aug 2016, another since 2016  . EPIDIDYMECTOMY Right 12/12/2014   Procedure: EPIDIDYMAL CYST EXCISION;  Surgeon: Cleon Gustin, MD;  Location: [facility];  Service: Urology;  Laterality: Right;  . HYDROCELE EXCISION Right 06-01-2007  . PROSTATE BIOPSY  2021    Home Medications:  Allergies as of 01/02/2020   No Known Allergies     Medication List    Notice   Cannot display discharge medications because the patient has not yet been admitted.     Allergies: No Known Allergies  Family History  Problem Relation Age of Onset  . Arthritis Mother   . Heart disease Mother   . Supraventricular tachycardia Mother   . Multiple myeloma Father   . Heart disease Father   . Cerebral palsy Sister   . Breast cancer Neg Hx   . Colon cancer Neg Hx   . Pancreatic cancer Neg Hx   . Prostate cancer Neg Hx     Social History:  reports that he has never smoked. He has never used smokeless tobacco. He reports current alcohol use. He reports that he  does not use drugs.  ROS: A complete review of systems was performed.  All systems are negative except for pertinent findings as noted.  Physical Exam:  Vital signs in last 24 hours: Ht 6' 1"  (1.854 m)   Wt 77.1 kg   BMI 22.43 kg/m  Constitutional:  Alert and oriented, No acute distress Cardiovascular: Regular rate  Respiratory: Normal respiratory effort GI: Abdomen is soft, nontender, nondistended, no abdominal masses. No CVAT.  Genitourinary: Normal male phallus, testes are descended bilaterally and non-tender and without masses, scrotum is normal in appearance without lesions or masses, perineum is normal on inspection. Lymphatic: No lymphadenopathy Neurologic: Grossly intact, no focal deficits Psychiatric: Normal mood and affect  Laboratory Data:  No results for input(s): WBC, HGB, HCT, PLT in the last 72 hours.  No results for input(s): NA, K, CL, GLUCOSE, BUN, CALCIUM, CREATININE in the last 72 hours.  Invalid input(s): CO3   No results found for this or any previous visit (from the past 24 hour(s)). Recent Results (from the past 240 hour(s))  SARS CORONAVIRUS 2 (TAT 6-24 HRS) Nasopharyngeal Nasopharyngeal Swab     Status: None   Collection Time: 12/30/19 10:31 AM   Specimen: Nasopharyngeal Swab  Result Value Ref Range Status   SARS Coronavirus 2 NEGATIVE NEGATIVE Final  Comment: (NOTE) SARS-CoV-2 target nucleic acids are NOT DETECTED.  The SARS-CoV-2 RNA is generally detectable in upper and lower respiratory specimens during the acute phase of infection. Negative results do not preclude SARS-CoV-2 infection, do not rule out co-infections with other pathogens, and should not be used as the sole basis for treatment or other patient management decisions. Negative results must be combined with clinical observations, patient history, and epidemiological information. The expected result is Negative.  Fact Sheet for  Patients: SugarRoll.be  Fact Sheet for Healthcare Providers: https://www.woods-mathews.com/  This test is not yet approved or cleared by the [place] FDA and  has been authorized for detection and/or diagnosis of SARS-CoV-2 by FDA under an Emergency Use Authorization (EUA). This EUA will remain  in effect (meaning this test can be used) for the duration of the COVID-19 declaration under Se ction 564(b)(1) of the Act, 21 U.S.C. section 360bbb-3(b)(1), unless the authorization is terminated or revoked sooner.  Performed at [facility], [place] [address], [place], [place] [address]     Renal Function: Recent Labs    12/30/19 0951  CREATININE 1.03   Estimated Creatinine Clearance: 73.8 mL/min (by C-G formula based on SCr of 1.03 mg/dL).  Radiologic Imaging: No results found.  Impression/Assessment:  PCa  Plan:  I 125 brachytherapy

## 2020-01-02 NOTE — Anesthesia Preprocedure Evaluation (Addendum)
Anesthesia Evaluation  Patient identified by MRN, date of birth, ID band Patient awake    Reviewed: Allergy & Precautions, NPO status , Patient's Chart, lab work & pertinent test results  Airway Mallampati: II  TM Distance: >3 FB Neck ROM: Full    Dental no notable dental hx.    Pulmonary neg pulmonary ROS,    Pulmonary exam normal breath sounds clear to auscultation       Cardiovascular negative cardio ROS Normal cardiovascular exam Rhythm:Regular Rate:Normal     Neuro/Psych PSYCHIATRIC DISORDERS Depression negative neurological ROS     GI/Hepatic negative GI ROS, Neg liver ROS,   Endo/Other  negative endocrine ROS  Renal/GU negative Renal ROS   Prostate ca    Musculoskeletal  (+) Arthritis , Osteoarthritis,    Abdominal   Peds  Hematology negative hematology ROS (+)   Anesthesia Other Findings   Reproductive/Obstetrics negative OB ROS                            Anesthesia Physical Anesthesia Plan  ASA: II  Anesthesia Plan: General   Post-op Pain Management:    Induction: Intravenous  PONV Risk Score and Plan: 3 and Ondansetron, Dexamethasone and Treatment may vary due to age or medical condition  Airway Management Planned: LMA  Additional Equipment: None  Intra-op Plan:   Post-operative Plan: Extubation in OR  Informed Consent: I have reviewed the patients History and Physical, chart, labs and discussed the procedure including the risks, benefits and alternatives for the proposed anesthesia with the patient or authorized representative who has indicated his/her understanding and acceptance.     Dental advisory given  Plan Discussed with: CRNA  Anesthesia Plan Comments:        Anesthesia Quick Evaluation

## 2020-01-03 ENCOUNTER — Ambulatory Visit (HOSPITAL_BASED_OUTPATIENT_CLINIC_OR_DEPARTMENT_OTHER): Payer: Medicare Other | Admitting: Anesthesiology

## 2020-01-03 ENCOUNTER — Ambulatory Visit (HOSPITAL_COMMUNITY): Payer: Medicare Other

## 2020-01-03 ENCOUNTER — Encounter (HOSPITAL_BASED_OUTPATIENT_CLINIC_OR_DEPARTMENT_OTHER): Payer: Self-pay | Admitting: Urology

## 2020-01-03 ENCOUNTER — Encounter (HOSPITAL_BASED_OUTPATIENT_CLINIC_OR_DEPARTMENT_OTHER)
Admission: RE | Disposition: A | Discharge status: Home / Self Care | Payer: Self-pay | Source: Other Acute Inpatient Hospital | Attending: Urology

## 2020-01-03 ENCOUNTER — Other Ambulatory Visit: Payer: Self-pay

## 2020-01-03 ENCOUNTER — Ambulatory Visit (HOSPITAL_BASED_OUTPATIENT_CLINIC_OR_DEPARTMENT_OTHER)
Admission: RE | Admit: 2020-01-03 | Discharge: 2020-01-03 | Disposition: A | Discharge status: Home / Self Care | Payer: Medicare Other | Source: Other Acute Inpatient Hospital | Attending: Urology | Admitting: Urology

## 2020-01-03 DIAGNOSIS — C61 Malignant neoplasm of prostate: Secondary | ICD-10-CM | POA: Diagnosis not present

## 2020-01-03 DIAGNOSIS — F32A Depression, unspecified: Secondary | ICD-10-CM | POA: Diagnosis not present

## 2020-01-03 HISTORY — DX: Syncope and collapse: R55

## 2020-01-03 HISTORY — PX: SPACE OAR INSTILLATION: SHX6769

## 2020-01-03 HISTORY — DX: Depression, unspecified: F32.A

## 2020-01-03 HISTORY — DX: Low back pain, unspecified: M54.50

## 2020-01-03 HISTORY — PX: RADIOACTIVE SEED IMPLANT: SHX5150

## 2020-01-03 HISTORY — DX: Bradycardia, unspecified: R00.1

## 2020-01-03 HISTORY — PX: CYSTOSCOPY: SHX5120

## 2020-01-03 HISTORY — DX: Ventricular premature depolarization: I49.3

## 2020-01-03 SURGERY — INSERTION, RADIATION SOURCE, PROSTATE
Anesthesia: General | Site: Rectum

## 2020-01-03 MED ORDER — DEXAMETHASONE SODIUM PHOSPHATE 10 MG/ML IJ SOLN
INTRAMUSCULAR | Status: AC
  Filled 2020-01-03: qty 1

## 2020-01-03 MED ORDER — SODIUM CHLORIDE 0.9 % IV SOLN
INTRAVENOUS | Status: AC | PRN
  Administered 2020-01-03: 200 mL

## 2020-01-03 MED ORDER — FLEET ENEMA 7-19 GM/118ML RE ENEM: 1.0000 | ENEMA | Freq: Once | RECTAL | Status: DC

## 2020-01-03 MED ORDER — HYDROMORPHONE HCL 1 MG/ML IJ SOLN: 0.2500 mg | INTRAMUSCULAR | Status: DC | PRN

## 2020-01-03 MED ORDER — MIDAZOLAM HCL 5 MG/5ML IJ SOLN
INTRAMUSCULAR | Status: DC | PRN
  Administered 2020-01-03: 2 mg via INTRAVENOUS

## 2020-01-03 MED ORDER — SODIUM CHLORIDE (PF) 0.9 % IJ SOLN
INTRAMUSCULAR | Status: DC | PRN
  Administered 2020-01-03: 10 mL

## 2020-01-03 MED ORDER — EPHEDRINE 5 MG/ML INJ
INTRAVENOUS | Status: AC
  Filled 2020-01-03: qty 10

## 2020-01-03 MED ORDER — ONDANSETRON HCL 4 MG/2ML IJ SOLN: 4.0000 mg | Freq: Once | INTRAMUSCULAR | Status: DC | PRN

## 2020-01-03 MED ORDER — MIDAZOLAM HCL 2 MG/2ML IJ SOLN
INTRAMUSCULAR | Status: AC
  Filled 2020-01-03: qty 2

## 2020-01-03 MED ORDER — ONDANSETRON HCL 4 MG/2ML IJ SOLN
INTRAMUSCULAR | Status: AC
  Filled 2020-01-03: qty 2

## 2020-01-03 MED ORDER — FENTANYL CITRATE (PF) 100 MCG/2ML IJ SOLN
INTRAMUSCULAR | Status: AC
  Filled 2020-01-03: qty 2

## 2020-01-03 MED ORDER — ACETAMINOPHEN 500 MG PO TABS
1000.0000 mg | ORAL_TABLET | Freq: Once | ORAL | Status: AC
  Administered 2020-01-03: 1000 mg via ORAL

## 2020-01-03 MED ORDER — PROPOFOL 10 MG/ML IV BOLUS
INTRAVENOUS | Status: AC
  Filled 2020-01-03: qty 20

## 2020-01-03 MED ORDER — DEXAMETHASONE SODIUM PHOSPHATE 10 MG/ML IJ SOLN
INTRAMUSCULAR | Status: DC | PRN
  Administered 2020-01-03: 10 mg via INTRAVENOUS

## 2020-01-03 MED ORDER — PROPOFOL 10 MG/ML IV BOLUS
INTRAVENOUS | Status: DC | PRN
  Administered 2020-01-03: 130 mg via INTRAVENOUS

## 2020-01-03 MED ORDER — FENTANYL CITRATE (PF) 100 MCG/2ML IJ SOLN
INTRAMUSCULAR | Status: DC | PRN
  Administered 2020-01-03: 25 ug via INTRAVENOUS
  Administered 2020-01-03: 50 ug via INTRAVENOUS
  Administered 2020-01-03: 25 ug via INTRAVENOUS

## 2020-01-03 MED ORDER — ONDANSETRON HCL 4 MG/2ML IJ SOLN
INTRAMUSCULAR | Status: DC | PRN
  Administered 2020-01-03: 4 mg via INTRAVENOUS

## 2020-01-03 MED ORDER — ACETAMINOPHEN 500 MG PO TABS
ORAL_TABLET | ORAL | Status: AC
  Filled 2020-01-03: qty 2

## 2020-01-03 MED ORDER — IOHEXOL 300 MG/ML  SOLN
INTRAMUSCULAR | Status: DC | PRN
  Administered 2020-01-03: 7 mL

## 2020-01-03 MED ORDER — EPHEDRINE SULFATE-NACL 50-0.9 MG/10ML-% IV SOSY
PREFILLED_SYRINGE | INTRAVENOUS | Status: DC | PRN
  Administered 2020-01-03 (×4): 5 mg via INTRAVENOUS

## 2020-01-03 MED ORDER — CEFAZOLIN SODIUM-DEXTROSE 2-4 GM/100ML-% IV SOLN
INTRAVENOUS | Status: AC
  Filled 2020-01-03: qty 100

## 2020-01-03 MED ORDER — LACTATED RINGERS IV SOLN: INTRAVENOUS | Status: DC

## 2020-01-03 MED ORDER — LIDOCAINE 2% (20 MG/ML) 5 ML SYRINGE
INTRAMUSCULAR | Status: AC
  Filled 2020-01-03: qty 5

## 2020-01-03 MED ORDER — STERILE WATER FOR IRRIGATION IR SOLN
Status: DC | PRN
  Administered 2020-01-03: 3 mL

## 2020-01-03 MED ORDER — CEFAZOLIN SODIUM-DEXTROSE 2-4 GM/100ML-% IV SOLN
2.0000 g | Freq: Once | INTRAVENOUS | Status: AC
  Administered 2020-01-03: 2 g via INTRAVENOUS

## 2020-01-03 MED ORDER — OXYCODONE HCL 5 MG/5ML PO SOLN: 5.0000 mg | Freq: Once | ORAL | Status: DC | PRN

## 2020-01-03 MED ORDER — OXYCODONE HCL 5 MG PO TABS: 5.0000 mg | ORAL_TABLET | Freq: Once | ORAL | Status: DC | PRN

## 2020-01-03 MED ORDER — LIDOCAINE 2% (20 MG/ML) 5 ML SYRINGE
INTRAMUSCULAR | Status: DC | PRN
  Administered 2020-01-03: 60 mg via INTRAVENOUS

## 2020-01-03 SURGICAL SUPPLY — 37 items
BAG DRN RND TRDRP ANRFLXCHMBR (UROLOGICAL SUPPLIES) ×3
BAG URINE DRAIN 2000ML AR STRL (UROLOGICAL SUPPLIES) ×5 IMPLANT
BLADE CLIPPER SENSICLIP SURGIC (BLADE) ×5 IMPLANT
CATH FOLEY 2WAY SLVR  5CC 16FR (CATHETERS) ×5
CATH FOLEY 2WAY SLVR 5CC 16FR (CATHETERS) ×3 IMPLANT
CATH ROBINSON RED A/P 16FR (CATHETERS) IMPLANT
CATH ROBINSON RED A/P 20FR (CATHETERS) ×5 IMPLANT
CLOTH BEACON ORANGE TIMEOUT ST (SAFETY) ×5 IMPLANT
CNTNR URN SCR LID CUP LEK RST (MISCELLANEOUS) ×6 IMPLANT
CONT SPEC 4OZ STRL OR WHT (MISCELLANEOUS) ×10
COVER BACK TABLE 60X90IN (DRAPES) ×5 IMPLANT
COVER MAYO STAND STRL (DRAPES) ×5 IMPLANT
DRAPE C-ARM 35X43 STRL (DRAPES) IMPLANT
DRSG TEGADERM 4X4.75 (GAUZE/BANDAGES/DRESSINGS) ×5 IMPLANT
DRSG TEGADERM 8X12 (GAUZE/BANDAGES/DRESSINGS) ×10 IMPLANT
GAUZE SPONGE 4X4 12PLY STRL LF (GAUZE/BANDAGES/DRESSINGS) ×5 IMPLANT
GLOVE BIO SURGEON STRL SZ 6.5 (GLOVE) IMPLANT
GLOVE BIO SURGEON STRL SZ7.5 (GLOVE) IMPLANT
GLOVE BIO SURGEON STRL SZ8 (GLOVE) ×10 IMPLANT
GLOVE BIO SURGEONS STRL SZ 6.5 (GLOVE)
GLOVE SURG ORTHO 8.5 STRL (GLOVE) ×20 IMPLANT
GLOVE SURG SS PI 6.5 STRL IVOR (GLOVE) IMPLANT
GOWN STRL REUS W/TWL XL LVL3 (GOWN DISPOSABLE) ×5 IMPLANT
HOLDER FOLEY CATH W/STRAP (MISCELLANEOUS) IMPLANT
I-Seed AgX100 ×340 IMPLANT
IMPL SPACEOAR VUE SYSTEM (Spacer) ×3 IMPLANT
IMPLANT SPACEOAR VUE SYSTEM (Spacer) ×5 IMPLANT
IV NS 1000ML (IV SOLUTION) ×5
IV NS 1000ML BAXH (IV SOLUTION) ×3 IMPLANT
KIT TURNOVER CYSTO (KITS) ×5 IMPLANT
MARKER SKIN DUAL TIP RULER LAB (MISCELLANEOUS) ×5 IMPLANT
PACK CYSTO (CUSTOM PROCEDURE TRAY) ×5 IMPLANT
SUT BONE WAX W31G (SUTURE) IMPLANT
SYR 10ML LL (SYRINGE) ×15 IMPLANT
TOWEL OR 17X26 10 PK STRL BLUE (TOWEL DISPOSABLE) ×5 IMPLANT
UNDERPAD 30X36 HEAVY ABSORB (UNDERPADS AND DIAPERS) ×10 IMPLANT
WATER STERILE IRR 500ML POUR (IV SOLUTION) ×5 IMPLANT

## 2020-01-03 NOTE — Discharge Instructions (Signed)
°  Post Anesthesia Home Care Instructions ° °Activity: °Get plenty of rest for the remainder of the day. A responsible adult should stay with you for 24 hours following the procedure.  °For the next 24 hours, DO NOT: °-Drive a car °-Operate machinery °-Drink alcoholic beverages °-Take any medication unless instructed by your physician °-Make any legal decisions or sign important papers. ° °Meals: °Start with liquid foods such as gelatin or soup. Progress to regular foods as tolerated. Avoid greasy, spicy, heavy foods. If nausea and/or vomiting occur, drink only clear liquids until the nausea and/or vomiting subsides. Call your physician if vomiting continues. ° °Special Instructions/Symptoms: °Your throat may feel dry or sore from the anesthesia or the breathing tube placed in your throat during surgery. If this causes discomfort, gargle with warm salt water. The discomfort should disappear within 24 hours. ° °If you had a scopolamine patch placed behind your ear for the management of post- operative nausea and/or vomiting: ° °1. The medication in the patch is effective for 72 hours, after which it should be removed.  Wrap patch in a tissue and discard in the trash. Wash hands thoroughly with soap and water. °2. You may remove the patch earlier than 72 hours if you experience unpleasant side effects which may include dry mouth, dizziness or visual disturbances. °3. Avoid touching the patch. Wash your hands with soap and water after contact with the patch. °  °Radioactive Seed Implant Home Care Instructions ° ° °Activity:    Rest for the remainder of the day.  Do not drive or operate equipment today.  You may resume normal  activities in a few days as instructed by your physician, without risk of harmful radiation exposure to those around you, provided you follow the time and distance precautions on the Radiation Oncology Instruction Sheet. ° ° °Meals: Drink plenty of lipuids and eat light foods, such as gelatin or  soup this evening .  You may return to normal meal plan tomorrow. ° °Return °To Work: You may return to work as instructed by your physician. ° °Special °Instruction:   If any seeds are found, use tweezers to pick up seeds and place in a glass container of any kind and bring to your physician's office. ° °Call your physician if any of these symptoms occur: ° °· Persistent or heavy bleeding °· Urine stream diminishes or stops completely after catheter is removed °· Fever equal to or greater than 101 degrees F °· Cloudy urine with a strong foul odor °· Severe pain ° °You may feel some burning pain and/or hesitancy when you urinate after the catheter is removed.  These symptoms may increase over the next few weeks, but should diminish within forur to six weeks.  Applying moist heat to the lower abdomen or a hot tub bath may help relieve the pain.  If the discomfort becomes severe, please call your physician for additional medications. ° ° ° °

## 2020-01-03 NOTE — Interval H&P Note (Signed)
History and Physical Interval Note:  01/03/2020 7:29 AM  Calvin Howe  has presented today for surgery, with the diagnosis of PROSTATE CANCER.  The various methods of treatment have been discussed with the patient and family. After consideration of risks, benefits and other options for treatment, the patient has consented to  Procedure(s) with comments: RADIOACTIVE SEED IMPLANT/BRACHYTHERAPY IMPLANT (N/A) - 90 MINS SPACE OAR INSTILLATION (N/A) CYSTOSCOPY FLEXIBLE (N/A) as a surgical intervention.  The patient's history has been reviewed, patient examined, no change in status, stable for surgery.  I have reviewed the patient's chart and labs.  Questions were answered to the patient's satisfaction.     Lillette Boxer Khiree Bukhari

## 2020-01-03 NOTE — Anesthesia Procedure Notes (Signed)
Procedure Name: LMA Insertion Date/Time: 01/03/2020 7:39 AM Performed by: Ladena Jacquez D, CRNA Pre-anesthesia Checklist: Patient identified, Emergency Drugs available, Suction available and Patient being monitored Patient Re-evaluated:Patient Re-evaluated prior to induction Oxygen Delivery Method: Circle system utilized Preoxygenation: Pre-oxygenation with 100% oxygen Induction Type: IV induction Ventilation: Mask ventilation without difficulty LMA: LMA inserted LMA Size: 5.0 Tube type: Oral Number of attempts: 1 Placement Confirmation: positive ETCO2 and breath sounds checked- equal and bilateral Tube secured with: Tape Dental Injury: Teeth and Oropharynx as per pre-operative assessment

## 2020-01-03 NOTE — Transfer of Care (Signed)
Immediate Anesthesia Transfer of Care Note  Patient: Deavin Forst  Procedure(s) Performed: RADIOACTIVE SEED IMPLANT/BRACHYTHERAPY IMPLANT (N/A Prostate) SPACE OAR INSTILLATION (N/A Rectum) CYSTOSCOPY FLEXIBLE (N/A Bladder)  Patient Location: PACU  Anesthesia Type:General  Level of Consciousness: awake, alert  and oriented  Airway & Oxygen Therapy: Patient Spontanous Breathing and Patient connected to face mask oxygen  Post-op Assessment: Report given to RN and Post -op Vital signs reviewed and stable  Post vital signs: Reviewed and stable  Last Vitals:  Vitals Value Taken Time  BP 143/68 01/03/20 0855  Temp    Pulse 55 01/03/20 0856  Resp 14 01/03/20 0856  SpO2 100 % 01/03/20 0856  Vitals shown include unvalidated device data.  Last Pain:  Vitals:   01/03/20 0610  TempSrc: Oral         Complications: No complications documented.

## 2020-01-03 NOTE — Anesthesia Postprocedure Evaluation (Signed)
Anesthesia Post Note  Patient: Calvin Howe  Procedure(s) Performed: RADIOACTIVE SEED IMPLANT/BRACHYTHERAPY IMPLANT (N/A Prostate) SPACE OAR INSTILLATION (N/A Rectum) CYSTOSCOPY FLEXIBLE (N/A Bladder)     Patient location during evaluation: PACU Anesthesia Type: General Level of consciousness: awake and alert, oriented and patient cooperative Pain management: pain level controlled Vital Signs Assessment: post-procedure vital signs reviewed and stable Respiratory status: spontaneous breathing, nonlabored ventilation and respiratory function stable Cardiovascular status: blood pressure returned to baseline and stable Postop Assessment: no apparent nausea or vomiting Anesthetic complications: no   No complications documented.  Last Vitals:  Vitals:   01/03/20 0915 01/03/20 0958  BP: 128/86 (!) 141/82  Pulse: 60 (!) 53  Resp: 11 15  Temp: (!) 36.3 C (!) 35.9 C  SpO2: 98% 100%    Last Pain:  Vitals:   01/03/20 0958  TempSrc:   PainSc: 0-No pain                 Pervis Hocking

## 2020-01-03 NOTE — Progress Notes (Signed)
°  Radiation Oncology         [phone] ________________________________  Name: Calvin Howe MRN: [id number]  Date: 01/03/2020  DOB: Jun 08, 1950       Prostate Seed Implant  CC:Calvin Dials, MD  No ref. provider found  DIAGNOSIS: 69 y.o. gentleman with Stage T1c adenocarcinoma of the prostate with Gleason score of 3+4, and PSA of 6.3  PROCEDURE: Insertion of radioactive I-125 seeds into the prostate gland.  RADIATION DOSE: 145 Gy, definitive therapy.  TECHNIQUE: Calvin Howe was brought to the operating room with the urologist. He was placed in the dorsolithotomy position. He was catheterized and a rectal tube was inserted. The perineum was shaved, prepped and draped. The ultrasound probe was then introduced into the rectum to see the prostate gland.  TREATMENT DEVICE: A needle grid was attached to the ultrasound probe stand and anchor needles were placed.  3D PLANNING: The prostate was imaged in 3D using a sagittal sweep of the prostate probe. These images were transferred to the planning computer. There, the prostate, urethra and rectum were defined on each axial reconstructed image. Then, the software created an optimized 3D plan and a few seed positions were adjusted. The quality of the plan was reviewed using [facility] information for the target and the following two organs at risk:  Urethra and Rectum.  Then the accepted plan was printed and handed off to the radiation therapist.  Under my supervision, the custom loading of the seeds and spacers was carried out and loaded into sealed vicryl sleeves.  These pre-loaded needles were then placed into the needle holder.Marland Kitchen  PROSTATE VOLUME STUDY:  Using transrectal ultrasound the volume of the prostate was verified to be 33.7 cc.  SPECIAL TREATMENT PROCEDURE/SUPERVISION AND HANDLING: The pre-loaded needles were then delivered under sagittal guidance. A total of 21 needles were used to deposit 68 seeds in the prostate gland. The individual  seed activity was 0.397 mCi.  SpaceOAR:  Yes  COMPLEX SIMULATION: At the end of the procedure, an anterior radiograph of the pelvis was obtained to document seed positioning and count. Cystoscopy was performed to check the urethra and bladder.  MICRODOSIMETRY: At the end of the procedure, the patient was emitting 0.063 mR/hr at 1 meter. Accordingly, he was considered safe for hospital discharge.  PLAN: The patient will return to the radiation oncology clinic for post implant CT dosimetry in three weeks.   ________________________________  Calvin Howe, M.D.

## 2020-01-04 ENCOUNTER — Encounter (HOSPITAL_BASED_OUTPATIENT_CLINIC_OR_DEPARTMENT_OTHER): Payer: Self-pay | Admitting: Urology

## 2020-01-14 DIAGNOSIS — Z08 Encounter for follow-up examination after completed treatment for malignant neoplasm: Secondary | ICD-10-CM | POA: Diagnosis not present

## 2020-01-14 DIAGNOSIS — X32XXXD Exposure to sunlight, subsequent encounter: Secondary | ICD-10-CM | POA: Diagnosis not present

## 2020-01-14 DIAGNOSIS — L570 Actinic keratosis: Secondary | ICD-10-CM | POA: Diagnosis not present

## 2020-01-14 DIAGNOSIS — Z85828 Personal history of other malignant neoplasm of skin: Secondary | ICD-10-CM | POA: Diagnosis not present

## 2020-01-26 DIAGNOSIS — C61 Malignant neoplasm of prostate: Secondary | ICD-10-CM | POA: Diagnosis not present

## 2020-02-01 ENCOUNTER — Telehealth: Payer: Self-pay | Admitting: *Deleted

## 2020-02-01 NOTE — Telephone Encounter (Signed)
CALLED PATIENT TO REMIND OF POST SEED APPTS. FOR 02-02-20, LVM FOR A RETURN CALL

## 2020-02-01 NOTE — Telephone Encounter (Signed)
XXXX 

## 2020-02-02 ENCOUNTER — Encounter: Payer: Self-pay | Admitting: Urology

## 2020-02-02 ENCOUNTER — Ambulatory Visit
Admission: RE | Admit: 2020-02-02 | Discharge: 2020-02-02 | Disposition: A | Discharge status: Home / Self Care | Payer: Medicare Other | Source: Ambulatory Visit | Attending: Radiation Oncology | Admitting: Radiation Oncology

## 2020-02-02 ENCOUNTER — Other Ambulatory Visit: Payer: Self-pay

## 2020-02-02 ENCOUNTER — Ambulatory Visit
Admission: RE | Admit: 2020-02-02 | Discharge: 2020-02-02 | Disposition: A | Discharge status: Home / Self Care | Payer: Medicare Other | Source: Ambulatory Visit | Attending: Urology | Admitting: Urology

## 2020-02-02 VITALS — BP 147/84 | HR 59 | Temp 97.7°F | Resp 18 | Wt 174.6 lb

## 2020-02-02 VITALS — BP 147/84 | HR 59 | Temp 97.7°F | Resp 18 | Ht 73.0 in | Wt 174.6 lb

## 2020-02-02 DIAGNOSIS — Z923 Personal history of irradiation: Secondary | ICD-10-CM | POA: Diagnosis not present

## 2020-02-02 DIAGNOSIS — C61 Malignant neoplasm of prostate: Secondary | ICD-10-CM

## 2020-02-02 DIAGNOSIS — Z79899 Other long term (current) drug therapy: Secondary | ICD-10-CM | POA: Diagnosis not present

## 2020-02-02 NOTE — Progress Notes (Signed)
Radiation Oncology         [phone] ________________________________  Name: Calvin Howe MRN: [id number]  Date: 02/02/2020  DOB: Nov 10, 1950  Post-Seed Follow-Up Visit Note  CC: Aura Dials, MD  Franchot Gallo, MD  Diagnosis:   69 y.o. gentleman with Stage T1c adenocarcinoma of the prostate with Gleason score of 3+4, and PSA of 6.3.    ICD-10-CM   1. Malignant neoplasm of prostate ([place])  C61     Interval Since Last Radiation:  4 weeks 01/03/20:  Insertion of radioactive I-125 seeds into the prostate gland; 145 Gy, definitive therapy with placement of SpaceOAR VUE gel.  Narrative:  The patient returns today for routine follow-up.  He is complaining of increased urinary frequency and urinary hesitation symptoms. He filled out a questionnaire regarding urinary function today providing and overall IPSS score of 13 characterizing his symptoms as mild-moderate with weak flow of stream, intermittency, urgency and nocturia x2.  He feels like his LUTS are gradually improving and he specifically denies gross hematuria, dysuria, straining to void, incomplete bladder emptying or incontinence.  He has had some constipation which is managed with stool softeners as needed.  He is also noted some right-sided flank pain intermittently for the past 2 to 3 weeks which seems to be associated with activity, especially bending and twisting.  He is using ibuprofen as needed with relief.  His pre-implant score was 3. He denies any abdominal pain, nausea, vomiting or diarrhea.  He reports a healthy appetite and is maintaining his weight.  Overall, he is quite pleased with his progress to date.  ALLERGIES:  has No Known Allergies.  Meds: Current Outpatient Medications  Medication Sig Dispense Refill  . buPROPion (WELLBUTRIN XL) 300 MG 24 hr tablet Take 300 mg by mouth every morning.    Marland Kitchen ibuprofen (ADVIL) 800 MG tablet Take 800 mg by mouth daily as needed.     No current facility-administered  medications for this visit.    Physical Findings: In general this is a well appearing Caucasian male in no acute distress. He's alert and oriented x4 and appropriate throughout the examination. Cardiopulmonary assessment is negative for acute distress and he exhibits normal effort.   Lab Findings: Lab Results  Component Value Date   WBC 4.1 12/30/2019   HGB 15.0 12/30/2019   HCT 44.5 12/30/2019   MCV 94.5 12/30/2019   PLT 181 12/30/2019    Radiographic Findings:  Patient underwent CT imaging in our clinic for post implant dosimetry. The CT will be reviewed by Dr. Tammi Klippel to confirm there is an adequate distribution of radioactive seeds throughout the prostate gland and ensure that there are no seeds in or near the rectum. We suspect the final radiation plan and dosimetry will show appropriate coverage of the prostate gland. He understands that we will call and inform him of any unexpected findings on further review of his imaging and dosimetry.  Impression/Plan: 69 y.o. gentleman with Stage T1c adenocarcinoma of the prostate with Gleason score of 3+4, and PSA of 6.3. The patient is recovering from the effects of radiation. His urinary symptoms should gradually improve over the next 4-6 months. We talked about this today. He is encouraged by his improvement already and is otherwise pleased with his outcome. We also talked about long-term follow-up for prostate cancer following seed implant. He understands that ongoing PSA determinations and digital rectal exams will help perform surveillance to rule out disease recurrence. He had a follow up appointment with Dr. Diona Fanti last  week. He understands what to expect with his PSA measures. Patient was also educated today about some of the long-term effects from radiation including a small risk for rectal bleeding and possibly erectile dysfunction. We talked about some of the general management approaches to these potential complications. However, I did  encourage the patient to contact our office or return at any point if he has questions or concerns related to his previous radiation and prostate cancer.    Nicholos Johns, PA-C

## 2020-02-02 NOTE — Progress Notes (Signed)
  Radiation Oncology         [phone] ________________________________  Name: Calvin Howe MRN: [id number]  Date: 02/02/2020  DOB: [birth year]  COMPLEX SIMULATION NOTE  NARRATIVE:  The patient was brought to the [facility] today following prostate seed implantation approximately one month ago.  Identity was confirmed.  All relevant records and images related to the planned course of therapy were reviewed.  Then, the patient was set-up supine.  CT images were obtained.  The CT images were loaded into the planning software.  Then the prostate and rectum were contoured.  Treatment planning then occurred.  The implanted iodine 125 seeds were identified by the physics staff for projection of radiation distribution  I have requested : 3D Simulation  I have requested a DVH of the following structures: Prostate and rectum.    ________________________________  Sheral Apley Tammi Klippel, M.D.

## 2020-02-02 NOTE — Progress Notes (Signed)
Patient has post seed appointment today with Ashlyn Bruning. Patient states nocturia 1-3 times per night. Patient denies dysuria. Patient states having some constipation and is taking fiber to help. Patient states urine stream is weak less than half the time. Patient states that he is emptying his bladder completely. Patient states having some urgency. Patient denies having any straining when urinating. Patient states that he has has a follow-up appointment with [facility] last week. Patient denies leakage.

## 2020-02-14 ENCOUNTER — Encounter: Payer: Self-pay | Admitting: Radiation Oncology

## 2020-02-14 DIAGNOSIS — C61 Malignant neoplasm of prostate: Secondary | ICD-10-CM | POA: Diagnosis not present

## 2020-02-24 DIAGNOSIS — Z23 Encounter for immunization: Secondary | ICD-10-CM | POA: Diagnosis not present

## 2020-03-03 NOTE — Progress Notes (Signed)
  Radiation Oncology         [phone] ________________________________  Name: Kinta Martis MRN: [id number]  Date: 02/14/2020  DOB: 1951-02-16  3D Planning Note   Prostate Brachytherapy Post-Implant Dosimetry  Diagnosis: 69 y.o. gentleman with Stage T1c adenocarcinoma of the prostate with Gleason score of 3+4, and PSA of 6.3.  Narrative: On a previous date, Corde Antonini returned following prostate seed implantation for post implant planning. He underwent CT scan complex simulation to delineate the three-dimensional structures of the pelvis and demonstrate the radiation distribution.  Since that time, the seed localization, and complex isodose planning with dose volume histograms have now been completed.  Results:   Prostate Coverage - The dose of radiation delivered to the 90% or more of the prostate gland (D90) was 107.47% of the prescription dose. This exceeds our goal of greater than 90%. Rectal Sparing - The volume of rectal tissue receiving the prescription dose or higher was 0.0 cc. This falls under our thresholds tolerance of 1.0 cc.  Impression: The prostate seed implant appears to show adequate target coverage and appropriate rectal sparing.  Plan:  The patient will continue to follow with urology for ongoing PSA determinations. I would anticipate a high likelihood for local tumor control with minimal risk for rectal morbidity.  ________________________________  Artist Pais Kathrynn Running, M.D.

## 2020-04-19 DIAGNOSIS — C61 Malignant neoplasm of prostate: Secondary | ICD-10-CM | POA: Diagnosis not present

## 2020-04-26 DIAGNOSIS — N5201 Erectile dysfunction due to arterial insufficiency: Secondary | ICD-10-CM | POA: Diagnosis not present

## 2020-04-26 DIAGNOSIS — C61 Malignant neoplasm of prostate: Secondary | ICD-10-CM | POA: Diagnosis not present

## 2020-06-09 DIAGNOSIS — N529 Male erectile dysfunction, unspecified: Secondary | ICD-10-CM | POA: Diagnosis not present

## 2020-06-09 DIAGNOSIS — M159 Polyosteoarthritis, unspecified: Secondary | ICD-10-CM | POA: Diagnosis not present

## 2020-06-09 DIAGNOSIS — R55 Syncope and collapse: Secondary | ICD-10-CM | POA: Diagnosis not present

## 2020-06-09 DIAGNOSIS — Z136 Encounter for screening for cardiovascular disorders: Secondary | ICD-10-CM | POA: Diagnosis not present

## 2020-06-09 DIAGNOSIS — R079 Chest pain, unspecified: Secondary | ICD-10-CM | POA: Diagnosis not present

## 2020-06-09 DIAGNOSIS — R140 Abdominal distension (gaseous): Secondary | ICD-10-CM | POA: Diagnosis not present

## 2020-06-09 DIAGNOSIS — Z0000 Encounter for general adult medical examination without abnormal findings: Secondary | ICD-10-CM | POA: Diagnosis not present

## 2020-06-09 DIAGNOSIS — K429 Umbilical hernia without obstruction or gangrene: Secondary | ICD-10-CM | POA: Diagnosis not present

## 2020-06-09 DIAGNOSIS — C61 Malignant neoplasm of prostate: Secondary | ICD-10-CM | POA: Diagnosis not present

## 2020-07-04 ENCOUNTER — Ambulatory Visit
Admission: RE | Admit: 2020-07-04 | Discharge: 2020-07-04 | Disposition: A | Discharge status: Home / Self Care | Payer: Medicare Other | Source: Ambulatory Visit | Attending: Family Medicine | Admitting: Family Medicine

## 2020-07-04 ENCOUNTER — Other Ambulatory Visit: Payer: Self-pay

## 2020-07-04 ENCOUNTER — Other Ambulatory Visit: Payer: Self-pay | Admitting: Family Medicine

## 2020-07-04 DIAGNOSIS — R52 Pain, unspecified: Secondary | ICD-10-CM

## 2020-07-04 DIAGNOSIS — M549 Dorsalgia, unspecified: Secondary | ICD-10-CM | POA: Diagnosis not present

## 2020-07-04 DIAGNOSIS — R079 Chest pain, unspecified: Secondary | ICD-10-CM | POA: Diagnosis not present

## 2020-07-04 DIAGNOSIS — Z8546 Personal history of malignant neoplasm of prostate: Secondary | ICD-10-CM | POA: Diagnosis not present

## 2020-07-04 DIAGNOSIS — R0781 Pleurodynia: Secondary | ICD-10-CM | POA: Diagnosis not present

## 2020-07-04 DIAGNOSIS — M47814 Spondylosis without myelopathy or radiculopathy, thoracic region: Secondary | ICD-10-CM | POA: Diagnosis not present

## 2020-07-04 DIAGNOSIS — M2578 Osteophyte, vertebrae: Secondary | ICD-10-CM | POA: Diagnosis not present

## 2020-08-22 DIAGNOSIS — C61 Malignant neoplasm of prostate: Secondary | ICD-10-CM | POA: Diagnosis not present

## 2020-09-01 DIAGNOSIS — Z8546 Personal history of malignant neoplasm of prostate: Secondary | ICD-10-CM | POA: Diagnosis not present

## 2020-09-12 DIAGNOSIS — D225 Melanocytic nevi of trunk: Secondary | ICD-10-CM | POA: Diagnosis not present

## 2020-09-12 DIAGNOSIS — X32XXXD Exposure to sunlight, subsequent encounter: Secondary | ICD-10-CM | POA: Diagnosis not present

## 2020-09-12 DIAGNOSIS — L570 Actinic keratosis: Secondary | ICD-10-CM | POA: Diagnosis not present

## 2020-09-12 DIAGNOSIS — Z1283 Encounter for screening for malignant neoplasm of skin: Secondary | ICD-10-CM | POA: Diagnosis not present

## 2020-11-30 ENCOUNTER — Ambulatory Visit: Payer: Medicare Other | Admitting: Cardiology

## 2020-11-30 ENCOUNTER — Encounter: Payer: Self-pay | Admitting: Cardiology

## 2020-11-30 ENCOUNTER — Other Ambulatory Visit: Payer: Self-pay

## 2020-11-30 VITALS — BP 144/76 | HR 57 | Temp 98.4°F | Resp 16 | Ht 73.0 in | Wt 176.0 lb

## 2020-11-30 DIAGNOSIS — R001 Bradycardia, unspecified: Secondary | ICD-10-CM | POA: Diagnosis not present

## 2020-11-30 DIAGNOSIS — I493 Ventricular premature depolarization: Secondary | ICD-10-CM | POA: Diagnosis not present

## 2020-11-30 DIAGNOSIS — R55 Syncope and collapse: Secondary | ICD-10-CM

## 2020-11-30 DIAGNOSIS — R030 Elevated blood-pressure reading, without diagnosis of hypertension: Secondary | ICD-10-CM

## 2020-11-30 NOTE — Progress Notes (Signed)
ID:  Calvin Howe, DOB 1950-09-27, MRN [id number]  PCP:  Aura Dials, MD  Cardiologist:  Rex Kras, DO, [facility] (established care 07/12/2019)  Date: 11/30/20 Last Office Visit: 12/08/2019   Chief Complaint  Patient presents with   Near Syncope   PVC    HPI  Calvin Howe is a 70 y.o. male who presents to the office with a  chief complaint of " 1 year follow-up for PVC/history of near syncope."  His past medical history and cardiovascular risk factors are: BPPV, PVC, near syncope, advanced age.   Patient was originally referred to the office for evaluation of recurring episodes of near syncope.   Patient presents for 1 year follow-up visit to reevaluateIf he has recurrence of near syncope and symptoms with regards to his PVC burden.  Over the last 1 year patient states that he is doing well from a cardiovascular standpoint. Denies any chest pain at rest or with effort related activities.  Patient's office blood pressures are within acceptable range.  FUNCTIONAL STATUS: No structured exercise or daily routine.  But active with doing wood working on a new house and able to go up several flights of stairs without any effort related symptoms.   ALLERGIES: No Known Allergies  MEDICATION LIST PRIOR TO VISIT: Current Meds  Medication Sig   buPROPion (WELLBUTRIN XL) 300 MG 24 hr tablet Take 300 mg by mouth every morning.   ibuprofen (ADVIL) 800 MG tablet Take 800 mg by mouth daily as needed.     PAST MEDICAL HISTORY: Past Medical History:  Diagnosis Date   Arthritis    Carpal tunnel syndrome    Depression    Eczema    Epididymal cyst    right   History of hydrocele    Lower back pain    in am gets better after moves around   Near syncope 06/2019 last episode   Prostate cancer ([place])    PVC (premature ventricular contraction)    Right carpal tunnel syndrome 06/26/2015   Sinus bradycardia    Wears glasses     PAST SURGICAL HISTORY: Past Surgical History:   Procedure Laterality Date   COLONOSCOPY WITH PROPOFOL  Aug 2016, another since 2016   CYSTOSCOPY N/A 01/03/2020   Procedure: CYSTOSCOPY FLEXIBLE;  Surgeon: Franchot Gallo, MD;  Location: [facility];  Service: Urology;  Laterality: N/A;  No seeds detected in bladder per Dr. Diona Fanti   EPIDIDYMECTOMY Right 12/12/2014   Procedure: EPIDIDYMAL CYST EXCISION;  Surgeon: Cleon Gustin, MD;  Location: [facility];  Service: Urology;  Laterality: Right;   HYDROCELE EXCISION Right 06-01-2007   PROSTATE BIOPSY  2021   RADIOACTIVE SEED IMPLANT N/A 01/03/2020   Procedure: RADIOACTIVE SEED IMPLANT/BRACHYTHERAPY IMPLANT;  Surgeon: Franchot Gallo, MD;  Location: [facility];  Service: Urology;  Laterality: N/A;  90 MINS   SPACE OAR INSTILLATION N/A 01/03/2020   Procedure: SPACE OAR INSTILLATION;  Surgeon: Franchot Gallo, MD;  Location: [facility];  Service: Urology;  Laterality: N/A;    FAMILY HISTORY: The patient family history includes Arthritis in his mother; Cerebral palsy in his sister; Heart disease in his father and mother; Multiple myeloma in his father; Supraventricular tachycardia in his mother.  SOCIAL HISTORY:  The patient  reports that he has never smoked. He has never used smokeless tobacco. He reports current alcohol use. He reports that he does not use drugs.  REVIEW OF SYSTEMS: Review of Systems  Constitutional: Negative for chills  and fever.  HENT:  Negative for hoarse voice and nosebleeds.   Eyes:  Negative for discharge, double vision and pain.  Cardiovascular:  Negative for chest pain, claudication, dyspnea on exertion, leg swelling, near-syncope, orthopnea, palpitations, paroxysmal nocturnal dyspnea and syncope.  Respiratory:  Negative for hemoptysis and shortness of breath.   Musculoskeletal:  Negative for muscle cramps and myalgias.  Gastrointestinal:  Negative for abdominal pain, constipation, diarrhea,  hematemesis, hematochezia, melena, nausea and vomiting.  Neurological:  Negative for dizziness, light-headedness and vertigo.   PHYSICAL EXAM: Vitals with BMI 11/30/2020 02/02/2020 02/02/2020  Height 6' 1"  - 6' 1"   Weight 176 lbs 174 lbs 10 oz 174 lbs 10 oz  BMI 23.23 62.95 28.41  Systolic 324 401 027  Diastolic 76 84 84  Pulse 57 59 59   CONSTITUTIONAL: Well-developed and well-nourished. No acute distress.  SKIN: Skin is warm and dry. No rash noted. No cyanosis. No pallor. No jaundice HEAD: Normocephalic and atraumatic.  EYES: No scleral icterus MOUTH/THROAT: Moist oral membranes.  NECK: No JVD present. No thyromegaly noted. No carotid bruits  LYMPHATIC: No visible cervical adenopathy.  CHEST Normal respiratory effort. No intercostal retractions  LUNGS: Clear to auscultation bilaterally.  No stridor. No wheezes. No rales.  CARDIOVASCULAR: Regular rate and rhythm, positive O5-D6, soft systolic murmur at the apex, no gallops appreciated ABDOMINAL: Soft, nontender, nondistended, positive bowel sounds in all 4 quadrants, no apparent ascites.  EXTREMITIES: No peripheral edema, 2+ DP and PT pulses bilaterally. HEMATOLOGIC: No significant bruising NEUROLOGIC: Oriented to person, place, and time. Nonfocal. Normal muscle tone.  PSYCHIATRIC: Normal mood and affect. Normal behavior. Cooperative  CARDIAC DATABASE: EKG: 11/30/2020: Normal sinus rhythm, 60 bpm,Incomplete right bundle branch block, without underlying ischemia or injury pattern.  Echocardiogram: 07/16/2019: LVEF 64%, grade 1 diastolic impairment, normal LAP, mild to moderate MR, mild TR.  Stress Testing: 12/2017:  The left ventricular ejection fraction is mildly decreased (45-54%). Nuclear stress EF: 54%. Blood pressure demonstrated a normal response to exercise. There was no ST segment deviation noted during stress. Defect 1: There is a small defect of mild severity present in the apex location This is a low risk study. Low  risk, probably normal stress nuclear study with mild apical thinning.  No ischemia.  Gated ejection fraction 54% with normal wall motion.  Note frequent PVCs with exercise and in recovery.  Couplets and 3 beats nonsustained ventricular tachycardia also noted in recovery.  Heart Catheterization: None  Carotid artery duplex  07/16/2019: No hemodynamically significant arterial disease in the internal carotid artery bilaterally.  Mild soft plaque noted in bilateral carotid arteries.  Antegrade right vertebral artery flow. Antegrade left vertebral artery flow.   24 hour Holter monitor: Dominant rhythm sinus bradycardia.  Heart rate 42-133 bpm.  Average heart rate 61 bpm.  No atrial fibrillation/atrial flutter/supraventricular tachycardia/high grade AV block, sinus pause greater than or equal to 3 seconds in duration.  Ventricular ectopy beats 2289, with 181 ventricular pairs, 13 ventricular trigeminy, 2 ventricular runs 3 beats in duration, and total ventricular ectopic burden 2.55%.  Supraventricular ectopy beats 856, and total supraventricular ectopic burden 0.95%.  Number of patient triggered events: 1.  Underlying rhythm sinus bradycardia without ectopy.  LABORATORY DATA: Labs from 05/28/2019 reviewed. Creatinine 1.09 mg/dL. eGFR: 80 mL/min per 1.73 m Lipid profile: Total cholesterol 156, triglycerides 79, HDL 45, LDL 96, non-HDL 111  IMPRESSION:    ICD-10-CM   1. PVC's (premature ventricular contractions)  I49.3 EKG 12-Lead    PCV CARDIAC  STRESS TEST    2. Near syncope  R55     3. Sinus bradycardia  R00.1 PCV CARDIAC STRESS TEST    4. Elevated blood pressure reading without diagnosis of hypertension  R03.0        RECOMMENDATIONS: Zykee Avakian is a 70 y.o. male whose past medical history and cardiac risk factors include: BPPV, PVC, near syncope, advanced age.  PVC's (premature ventricular contractions) Patient is noted to have a PVC burden of 2.6%. Chose not to be on  pharmacological therapy. Remains asymptomatic.  Sinus bradycardia Asymptomatic. Exercise treadmill stress test to evaluate for chronotropic competence, functional status, and exercise-induced ischemia  Near syncope Relatively stable since last office visit. Will continue to monitor.  Elevated blood pressure reading without diagnosis of hypertension Patient's blood pressure at today's office visit is elevated. Recommended to keep a log of his blood pressures at home and if his systolic blood pressures are consistently greater than 130 mmHg I have asked him to either discuss it with either myself or his PCP to see if medications are warranted. Low-salt diet recommended.  FINAL MEDICATION LIST END OF ENCOUNTER: No orders of the defined types were placed in this encounter.    Current Outpatient Medications:    buPROPion (WELLBUTRIN XL) 300 MG 24 hr tablet, Take 300 mg by mouth every morning., Disp: , Rfl:    ibuprofen (ADVIL) 800 MG tablet, Take 800 mg by mouth daily as needed., Disp: , Rfl:   Orders Placed This Encounter  Procedures   PCV CARDIAC STRESS TEST   EKG 12-Lead    --Continue cardiac medications as reconciled in final medication list. --Return in about 1 year (around 11/30/2021) for Follow up PVCs. Or sooner if needed. --Continue follow-up with your primary care physician regarding the management of your other chronic comorbid conditions.  Patient's questions and concerns were addressed to his satisfaction. He voices understanding of the instructions provided during this encounter.   This note was created using a voice recognition software as a result there may be grammatical errors inadvertently enclosed that do not reflect the nature of this encounter. Every attempt is made to correct such errors.  Total time spent: 20 minutes.  Rex Kras, [place], [facility]  Pager: [phone] Office: [phone]

## 2020-12-07 ENCOUNTER — Ambulatory Visit: Payer: Federal, State, Local not specified - PPO | Admitting: Cardiology

## 2020-12-21 ENCOUNTER — Other Ambulatory Visit: Payer: Self-pay

## 2020-12-21 ENCOUNTER — Ambulatory Visit: Payer: Medicare Other

## 2020-12-21 DIAGNOSIS — I493 Ventricular premature depolarization: Secondary | ICD-10-CM

## 2020-12-21 DIAGNOSIS — R55 Syncope and collapse: Secondary | ICD-10-CM | POA: Diagnosis not present

## 2020-12-21 DIAGNOSIS — R001 Bradycardia, unspecified: Secondary | ICD-10-CM | POA: Diagnosis not present

## 2021-01-02 NOTE — Progress Notes (Signed)
Called pt to inform him about his stress test results. Pt understood

## 2021-03-21 DIAGNOSIS — C61 Malignant neoplasm of prostate: Secondary | ICD-10-CM | POA: Diagnosis not present

## 2021-03-28 DIAGNOSIS — Z8546 Personal history of malignant neoplasm of prostate: Secondary | ICD-10-CM | POA: Diagnosis not present

## 2021-03-28 DIAGNOSIS — N5201 Erectile dysfunction due to arterial insufficiency: Secondary | ICD-10-CM | POA: Diagnosis not present

## 2021-06-20 DIAGNOSIS — I493 Ventricular premature depolarization: Secondary | ICD-10-CM | POA: Diagnosis not present

## 2021-06-20 DIAGNOSIS — K635 Polyp of colon: Secondary | ICD-10-CM | POA: Diagnosis not present

## 2021-06-20 DIAGNOSIS — K429 Umbilical hernia without obstruction or gangrene: Secondary | ICD-10-CM | POA: Diagnosis not present

## 2021-06-20 DIAGNOSIS — G479 Sleep disorder, unspecified: Secondary | ICD-10-CM | POA: Diagnosis not present

## 2021-06-20 DIAGNOSIS — R140 Abdominal distension (gaseous): Secondary | ICD-10-CM | POA: Diagnosis not present

## 2021-06-20 DIAGNOSIS — C61 Malignant neoplasm of prostate: Secondary | ICD-10-CM | POA: Diagnosis not present

## 2021-06-20 DIAGNOSIS — Z0000 Encounter for general adult medical examination without abnormal findings: Secondary | ICD-10-CM | POA: Diagnosis not present

## 2021-06-20 DIAGNOSIS — Z13220 Encounter for screening for lipoid disorders: Secondary | ICD-10-CM | POA: Diagnosis not present

## 2021-06-20 DIAGNOSIS — K219 Gastro-esophageal reflux disease without esophagitis: Secondary | ICD-10-CM | POA: Diagnosis not present

## 2021-06-20 DIAGNOSIS — M130 Polyarthritis, unspecified: Secondary | ICD-10-CM | POA: Diagnosis not present

## 2021-07-31 ENCOUNTER — Ambulatory Visit: Payer: Self-pay | Admitting: Surgery

## 2021-07-31 DIAGNOSIS — K429 Umbilical hernia without obstruction or gangrene: Secondary | ICD-10-CM | POA: Diagnosis not present

## 2021-08-28 ENCOUNTER — Encounter (HOSPITAL_COMMUNITY): Payer: Self-pay | Admitting: Surgery

## 2021-08-28 ENCOUNTER — Other Ambulatory Visit: Payer: Self-pay

## 2021-08-29 ENCOUNTER — Ambulatory Visit (HOSPITAL_COMMUNITY)
Admission: RE | Admit: 2021-08-29 | Discharge: 2021-08-29 | Disposition: A | Discharge status: Home / Self Care | Payer: Medicare Other | Attending: Surgery | Admitting: Surgery

## 2021-08-29 ENCOUNTER — Encounter (HOSPITAL_COMMUNITY): Payer: Self-pay | Admitting: Surgery

## 2021-08-29 ENCOUNTER — Ambulatory Visit (HOSPITAL_COMMUNITY): Payer: Medicare Other

## 2021-08-29 ENCOUNTER — Encounter (HOSPITAL_COMMUNITY)
Admission: RE | Disposition: A | Discharge status: Home / Self Care | Payer: Self-pay | Source: Home / Self Care | Attending: Surgery

## 2021-08-29 ENCOUNTER — Ambulatory Visit (HOSPITAL_BASED_OUTPATIENT_CLINIC_OR_DEPARTMENT_OTHER): Payer: Medicare Other

## 2021-08-29 ENCOUNTER — Other Ambulatory Visit: Payer: Self-pay

## 2021-08-29 DIAGNOSIS — Z79899 Other long term (current) drug therapy: Secondary | ICD-10-CM | POA: Insufficient documentation

## 2021-08-29 DIAGNOSIS — K429 Umbilical hernia without obstruction or gangrene: Secondary | ICD-10-CM | POA: Insufficient documentation

## 2021-08-29 DIAGNOSIS — Z8546 Personal history of malignant neoplasm of prostate: Secondary | ICD-10-CM | POA: Insufficient documentation

## 2021-08-29 HISTORY — PX: UMBILICAL HERNIA REPAIR: SHX196

## 2021-08-29 HISTORY — DX: Cardiac arrhythmia, unspecified: I49.9

## 2021-08-29 LAB — CBC
HCT: 41.4 % (ref 39.0–52.0)
Hemoglobin: 14.2 g/dL (ref 13.0–17.0)
MCH: 32.1 pg (ref 26.0–34.0)
MCHC: 34.3 g/dL (ref 30.0–36.0)
MCV: 93.5 fL (ref 80.0–100.0)
Platelets: 156 10*3/uL (ref 150–400)
RBC: 4.43 MIL/uL (ref 4.22–5.81)
RDW: 12.7 % (ref 11.5–15.5)
WBC: 4.4 10*3/uL (ref 4.0–10.5)
nRBC: 0 % (ref 0.0–0.2)

## 2021-08-29 LAB — BASIC METABOLIC PANEL
Anion gap: 6 (ref 5–15)
BUN: 15 mg/dL (ref 8–23)
CO2: 25 mmol/L (ref 22–32)
Calcium: 9 mg/dL (ref 8.9–10.3)
Chloride: 107 mmol/L (ref 98–111)
Creatinine, Ser: 1.14 mg/dL (ref 0.61–1.24)
GFR, Estimated: >60 mL/min (ref >60)
Glucose, Bld: 96 mg/dL (ref 70–99)
Potassium: 5 mmol/L (ref 3.5–5.1)
Sodium: 138 mmol/L (ref 135–145)

## 2021-08-29 SURGERY — REPAIR, HERNIA, UMBILICAL, ADULT
Anesthesia: General

## 2021-08-29 MED ORDER — FENTANYL CITRATE (PF) 250 MCG/5ML IJ SOLN
INTRAMUSCULAR | Status: DC | PRN
  Administered 2021-08-29: 100 ug via INTRAVENOUS

## 2021-08-29 MED ORDER — 0.9 % SODIUM CHLORIDE (POUR BTL) OPTIME
TOPICAL | Status: DC | PRN
  Administered 2021-08-29: 1000 mL

## 2021-08-29 MED ORDER — MIDAZOLAM HCL 2 MG/2ML IJ SOLN
INTRAMUSCULAR | Status: AC
  Filled 2021-08-29: qty 2

## 2021-08-29 MED ORDER — ACETAMINOPHEN 500 MG PO TABS
1000.0000 mg | ORAL_TABLET | ORAL | Status: AC
  Administered 2021-08-29: 1000 mg via ORAL
  Filled 2021-08-29: qty 2

## 2021-08-29 MED ORDER — BUPIVACAINE-EPINEPHRINE 0.25% -1:200000 IJ SOLN
INTRAMUSCULAR | Status: DC | PRN
  Administered 2021-08-29: 30 mL

## 2021-08-29 MED ORDER — PROPOFOL 10 MG/ML IV BOLUS
INTRAVENOUS | Status: AC
  Filled 2021-08-29: qty 20

## 2021-08-29 MED ORDER — DEXAMETHASONE SODIUM PHOSPHATE 10 MG/ML IJ SOLN
INTRAMUSCULAR | Status: DC | PRN
  Administered 2021-08-29: 10 mg via INTRAVENOUS

## 2021-08-29 MED ORDER — ONDANSETRON HCL 4 MG/2ML IJ SOLN
INTRAMUSCULAR | Status: DC | PRN
  Administered 2021-08-29: 4 mg via INTRAVENOUS

## 2021-08-29 MED ORDER — GABAPENTIN 300 MG PO CAPS
300.0000 mg | ORAL_CAPSULE | ORAL | Status: AC
  Administered 2021-08-29: 300 mg via ORAL
  Filled 2021-08-29: qty 1

## 2021-08-29 MED ORDER — CHLORHEXIDINE GLUCONATE 0.12 % MT SOLN
15.0000 mL | Freq: Once | OROMUCOSAL | Status: AC
  Administered 2021-08-29: 15 mL via OROMUCOSAL
  Filled 2021-08-29: qty 15

## 2021-08-29 MED ORDER — TRAMADOL HCL 50 MG PO TABS
ORAL_TABLET | ORAL | Status: AC
  Filled 2021-08-29: qty 1

## 2021-08-29 MED ORDER — LIDOCAINE 2% (20 MG/ML) 5 ML SYRINGE
INTRAMUSCULAR | Status: AC
Start: 2021-08-29 — End: ?
  Filled 2021-08-29: qty 5

## 2021-08-29 MED ORDER — ACETAMINOPHEN 500 MG PO TABS
ORAL_TABLET | ORAL | Status: AC
  Filled 2021-08-29: qty 1

## 2021-08-29 MED ORDER — ORAL CARE MOUTH RINSE: 15.0000 mL | Freq: Once | OROMUCOSAL | Status: AC

## 2021-08-29 MED ORDER — DEXAMETHASONE SODIUM PHOSPHATE 10 MG/ML IJ SOLN
INTRAMUSCULAR | Status: AC
  Filled 2021-08-29: qty 1

## 2021-08-29 MED ORDER — LACTATED RINGERS IV SOLN
INTRAVENOUS | Status: DC
Start: 2021-08-29 — End: 2021-08-29

## 2021-08-29 MED ORDER — EPHEDRINE SULFATE-NACL 50-0.9 MG/10ML-% IV SOSY
PREFILLED_SYRINGE | INTRAVENOUS | Status: DC | PRN
  Administered 2021-08-29: 5 mg via INTRAVENOUS

## 2021-08-29 MED ORDER — TRAMADOL HCL 50 MG PO TABS: 50.0000 mg | ORAL_TABLET | Freq: Four times a day (QID) | ORAL | 0 refills | Status: AC | PRN

## 2021-08-29 MED ORDER — FENTANYL CITRATE (PF) 250 MCG/5ML IJ SOLN
INTRAMUSCULAR | Status: AC
  Filled 2021-08-29: qty 5

## 2021-08-29 MED ORDER — PROPOFOL 10 MG/ML IV BOLUS
INTRAVENOUS | Status: DC | PRN
  Administered 2021-08-29: 160 mg via INTRAVENOUS

## 2021-08-29 MED ORDER — LIDOCAINE 2% (20 MG/ML) 5 ML SYRINGE
INTRAMUSCULAR | Status: DC | PRN
  Administered 2021-08-29: 60 mg via INTRAVENOUS

## 2021-08-29 MED ORDER — SUGAMMADEX SODIUM 200 MG/2ML IV SOLN
INTRAVENOUS | Status: DC | PRN
  Administered 2021-08-29: 200 mg via INTRAVENOUS

## 2021-08-29 MED ORDER — BUPIVACAINE-EPINEPHRINE (PF) 0.25% -1:200000 IJ SOLN
INTRAMUSCULAR | Status: AC
  Filled 2021-08-29: qty 30

## 2021-08-29 MED ORDER — ONDANSETRON HCL 4 MG/2ML IJ SOLN
INTRAMUSCULAR | Status: AC
  Filled 2021-08-29: qty 2

## 2021-08-29 MED ORDER — ROCURONIUM BROMIDE 10 MG/ML (PF) SYRINGE
PREFILLED_SYRINGE | INTRAVENOUS | Status: AC
Start: 2021-08-29 — End: ?
  Filled 2021-08-29: qty 10

## 2021-08-29 MED ORDER — TRAMADOL HCL 50 MG PO TABS
50.0000 mg | ORAL_TABLET | Freq: Once | ORAL | Status: AC
  Administered 2021-08-29: 50 mg via ORAL

## 2021-08-29 MED ORDER — CEFAZOLIN SODIUM-DEXTROSE 2-4 GM/100ML-% IV SOLN
2.0000 g | INTRAVENOUS | Status: AC
  Administered 2021-08-29: 2 g via INTRAVENOUS
  Filled 2021-08-29: qty 100

## 2021-08-29 MED ORDER — MIDAZOLAM HCL 2 MG/2ML IJ SOLN
INTRAMUSCULAR | Status: DC | PRN
  Administered 2021-08-29: 2 mg via INTRAVENOUS

## 2021-08-29 MED ORDER — ROCURONIUM BROMIDE 10 MG/ML (PF) SYRINGE
PREFILLED_SYRINGE | INTRAVENOUS | Status: DC | PRN
  Administered 2021-08-29: 10 mg via INTRAVENOUS
  Administered 2021-08-29: 60 mg via INTRAVENOUS

## 2021-08-29 SURGICAL SUPPLY — 33 items
BAG COUNTER SPONGE SURGICOUNT (BAG) ×2 IMPLANT
BLADE CLIPPER SURG (BLADE) IMPLANT
CANISTER SUCT 3000ML PPV (MISCELLANEOUS) IMPLANT
CHLORAPREP W/TINT 26 (MISCELLANEOUS) ×2 IMPLANT
COVER SURGICAL LIGHT HANDLE (MISCELLANEOUS) ×2 IMPLANT
DERMABOND ADVANCED (GAUZE/BANDAGES/DRESSINGS) ×1
DERMABOND ADVANCED .7 DNX12 (GAUZE/BANDAGES/DRESSINGS) ×1 IMPLANT
DRAPE LAPAROTOMY 100X72 PEDS (DRAPES) ×2 IMPLANT
ELECT CAUTERY BLADE 6.4 (BLADE) IMPLANT
ELECT REM PT RETURN 9FT ADLT (ELECTROSURGICAL) ×2
ELECTRODE REM PT RTRN 9FT ADLT (ELECTROSURGICAL) ×1 IMPLANT
GLOVE BIOGEL PI IND STRL 6 (GLOVE) ×1 IMPLANT
GLOVE BIOGEL PI INDICATOR 6 (GLOVE) ×1
GLOVE BIOGEL PI MICRO 5.5 (GLOVE) ×1
GLOVE BIOGEL PI MICRO STRL 5.5 (GLOVE) ×1 IMPLANT
GOWN STRL REUS W/ TWL LRG LVL3 (GOWN DISPOSABLE) ×2 IMPLANT
GOWN STRL REUS W/TWL LRG LVL3 (GOWN DISPOSABLE) ×4
KIT BASIN OR (CUSTOM PROCEDURE TRAY) ×2 IMPLANT
KIT TURNOVER KIT B (KITS) ×2 IMPLANT
NDL HYPO 25GX1X1/2 BEV (NEEDLE) ×1 IMPLANT
NEEDLE HYPO 25GX1X1/2 BEV (NEEDLE) ×2 IMPLANT
NS IRRIG 1000ML POUR BTL (IV SOLUTION) ×2 IMPLANT
PACK GENERAL/GYN (CUSTOM PROCEDURE TRAY) ×2 IMPLANT
PAD ARMBOARD 7.5X6 YLW CONV (MISCELLANEOUS) ×2 IMPLANT
PENCIL SMOKE EVACUATOR (MISCELLANEOUS) ×2 IMPLANT
SUT MNCRL AB 4-0 PS2 18 (SUTURE) ×3 IMPLANT
SUT NOVA NAB DX-16 0-1 5-0 T12 (SUTURE) ×2 IMPLANT
SUT NOVA NAB GS-21 0 18 T12 DT (SUTURE) ×3 IMPLANT
SUT VIC AB 3-0 SH 27 (SUTURE) ×4
SUT VIC AB 3-0 SH 27X BRD (SUTURE) ×1 IMPLANT
SYR CONTROL 10ML LL (SYRINGE) ×2 IMPLANT
TOWEL GREEN STERILE (TOWEL DISPOSABLE) ×2 IMPLANT
TOWEL GREEN STERILE FF (TOWEL DISPOSABLE) ×2 IMPLANT

## 2021-08-29 NOTE — Op Note (Signed)
Date: 08/29/21  Patient: Calvin Howe MRN: [id number]  Preoperative Diagnosis: Umbilical hernia Postoperative Diagnosis: Same  Procedure: Open primary umbilical hernia repair  Surgeon: Michaelle Birks, MD  EBL: Minimal  Anesthesia: General endotracheal  Specimens: None  Indications: [name] is a 71 yo male who presented with a reducible but symptomatic umbilical hernia. He elected to proceed with repair.  Findings: 6.7JQ umbilical hernia containing viable preperitoneal fat.  Repaired primarily.  Procedure details: Informed consent was obtained in the preoperative area prior to the procedure. The patient was brought to the operating room and placed on the table in the supine position.  General anesthesia was induced and appropriate lines and drains were placed for intraoperative monitoring. Perioperative antibiotics were administered per SCIP guidelines. The abdomen was prepped and draped in the usual sterile fashion. A pre-procedure timeout was taken verifying patient identity, surgical site and procedure to be performed.  A curvilinear supraumbilical skin incision was made overlying the palpable hernia.  The subcutaneous tissue was divided with cautery and the hernia sac was encountered.  The sac was dissected out of the surrounding subcutaneous tissue using blunt dissection.  The sac was then dissected off the surrounding fascia and separated from the fascia using cautery.  The hernia sac contained viable preperitoneal fat but no bowel.  The fat could not be entirely reduced and so a portion of it was amputated using cautery and discarded.  The remaining preperitoneal fat was reduced back into the abdomen. The resulting hernia defect was measured at approximately 1.5 cm in diameter.  The decision was made to perform a primary repair.  The hernia defect was closed with interrupted 0 Novafil sutures.  The wound was irrigated and hemostasis was achieved with cautery.  The subcutaneous tissue  was closed with interrupted 3-0 Vicryl sutures, and the skin was closed with a running subcuticular 4-0 Monocryl suture.  Dermabond was applied.  The patient tolerated the procedure well with no apparent complications.  All counts were correct x2 at the end of the procedure. The patient was extubated and taken to PACU in stable condition.  Michaelle Birks, MD 08/29/21 12:04 PM

## 2021-08-29 NOTE — Anesthesia Procedure Notes (Signed)
Procedure Name: Intubation Date/Time: 08/29/2021 10:51 AM  Performed by: Georgia Duff, CRNAPre-anesthesia Checklist: Patient identified, Emergency Drugs available, Suction available and Patient being monitored Patient Re-evaluated:Patient Re-evaluated prior to induction Oxygen Delivery Method: Circle System Utilized Preoxygenation: Pre-oxygenation with 100% oxygen Induction Type: IV induction Ventilation: Mask ventilation without difficulty and Oral airway inserted - appropriate to patient size Laryngoscope Size: Mac and 4 Grade View: Grade I Tube type: Oral Tube size: 7.5 mm Number of attempts: 1 Airway Equipment and Method: Stylet and Oral airway Placement Confirmation: ETT inserted through vocal cords under direct vision, positive ETCO2 and breath sounds checked- equal and bilateral Secured at: 23 cm Tube secured with: Tape Dental Injury: Teeth and Oropharynx as per pre-operative assessment

## 2021-08-29 NOTE — Transfer of Care (Signed)
Immediate Anesthesia Transfer of Care Note  Patient: Calvin Howe  Procedure(s) Performed: HERNIA REPAIR UMBILICAL ADULT  Patient Location: PACU  Anesthesia Type:General  Level of Consciousness: awake, alert  and oriented  Airway & Oxygen Therapy: Patient Spontanous Breathing  Post-op Assessment: Report given to RN, Post -op Vital signs reviewed and stable and Patient moving all extremities X 4  Post vital signs: Reviewed and stable  Last Vitals:  Vitals Value Taken Time  BP 146/73 08/29/21 1215  Temp    Pulse 50 08/29/21 1217  Resp 12 08/29/21 1217  SpO2 96 % 08/29/21 1217  Vitals shown include unvalidated device data.  Last Pain:  Vitals:   08/29/21 0853  TempSrc:   PainSc: 0-No pain         Complications: No notable events documented.

## 2021-08-29 NOTE — Discharge Instructions (Addendum)
[facility] [place] [facility] DISCHARGE INSTRUCTIONS  Activity No heavy lifting greater than 15 pounds for 6 weeks after surgery. Ok to shower in 24 hours, but do not bathe or submerge incisions underwater. Do not drive for the first 2-3 days after surgery or while taking narcotic pain medication.  Wound Care Your incisions are covered with skin glue called Dermabond. This will peel off on its own over time. You may shower and allow warm soapy water to run over your incisions. Gently pat dry. Do not submerge your incision underwater. Monitor your incision for any new redness, tenderness, or drainage.  When to Call us: Fever greater than 100.5 New redness, drainage, or swelling at incision site Severe pain, nausea, or vomiting  Follow-up You have an appointment scheduled with Dr. Zenia Resides on September 18, 2021 at 9:30am. This will be at the [facility] office at [address], [place], [place], [place]. Please arrive at least 15 minutes prior to your scheduled appointment time.  For questions or concerns, please call the office at [phone].

## 2021-08-29 NOTE — H&P (Signed)
Calvin Howe is an 71 y.o. male.   Chief Complaint: hernia HPI: Calvin Howe is a 71 y.o. male who was referred to me for evaluation of an umbilical hernia. He says it has been for several years, but the symptoms are getting more frequent. He notices it particularly after standing for a while and with activity. He also feels like after he eats he gets bloated, and the hernia becomes more noticeable and uncomfortable. He is always able to reduce it if he lies down for several minutes. He does not have any obstructive symptoms.   Two years ago he underwent a cardiac work-up for PVCs. Echo in May 2021 showed normal LV function, and a stress test in Oct 2022 did not show any ischemia. He has not had any prior abdominal surgeries.  Past Medical History:  Diagnosis Date   Arthritis    Carpal tunnel syndrome    Depression    Dysrhythmia    Eczema    Epididymal cyst    right   History of hydrocele    Lower back pain    in am gets better after moves around   Near syncope 06/2019 last episode   Prostate cancer ([place])    PVC (premature ventricular contraction)    Right carpal tunnel syndrome 06/26/2015   Sinus bradycardia    Wears glasses     Past Surgical History:  Procedure Laterality Date   COLONOSCOPY WITH PROPOFOL  Aug 2016, another since 2016   CYSTOSCOPY N/A 01/03/2020   Procedure: CYSTOSCOPY FLEXIBLE;  Surgeon: Franchot Gallo, MD;  Location: [facility];  Service: Urology;  Laterality: N/A;  No seeds detected in bladder per Dr. Diona Fanti   EPIDIDYMECTOMY Right 12/12/2014   Procedure: EPIDIDYMAL CYST EXCISION;  Surgeon: Cleon Gustin, MD;  Location: [facility];  Service: Urology;  Laterality: Right;   HYDROCELE EXCISION Right 06-01-2007   PROSTATE BIOPSY  2021   RADIOACTIVE SEED IMPLANT N/A 01/03/2020   Procedure: RADIOACTIVE SEED IMPLANT/BRACHYTHERAPY IMPLANT;  Surgeon: Franchot Gallo, MD;  Location: [facility];  Service:  Urology;  Laterality: N/A;  90 MINS   SPACE OAR INSTILLATION N/A 01/03/2020   Procedure: SPACE OAR INSTILLATION;  Surgeon: Franchot Gallo, MD;  Location: [facility];  Service: Urology;  Laterality: N/A;    Family History  Problem Relation Age of Onset   Arthritis Mother    Heart disease Mother    Supraventricular tachycardia Mother    Multiple myeloma Father    Heart disease Father    Cerebral palsy Sister    Breast cancer Neg Hx    Colon cancer Neg Hx    Pancreatic cancer Neg Hx    Prostate cancer Neg Hx    Social History:  reports that he has never smoked. He has never used smokeless tobacco. He reports current alcohol use. He reports that he does not use drugs.  Allergies: No Known Allergies  Medications Prior to Admission  Medication Sig Dispense Refill   Ascorbic Acid (VITAMIN C) 1000 MG tablet Take 1,000 mg by mouth daily.     buPROPion (WELLBUTRIN XL) 300 MG 24 hr tablet Take 300 mg by mouth every morning.     ibuprofen (ADVIL) 800 MG tablet Take 800 mg by mouth daily as needed (arthritis pain).     Probiotic Product (ALIGN) 4 MG CAPS Take 4 mg by mouth daily.     TURMERIC PO Take 2 capsules by mouth daily. turmeric boswellia  Results for orders placed or performed during the hospital encounter of 08/29/21 (from the past 48 hour(s))  CBC per protocol     Status: None   Collection Time: 08/29/21  9:21 AM  Result Value Ref Range   WBC 4.4 4.0 - 10.5 K/uL   RBC 4.43 4.22 - 5.81 MIL/uL   Hemoglobin 14.2 13.0 - 17.0 g/dL   HCT 41.4 39.0 - 52.0 %   MCV 93.5 80.0 - 100.0 fL   MCH 32.1 26.0 - 34.0 pg   MCHC 34.3 30.0 - 36.0 g/dL   RDW 12.7 11.5 - 15.5 %   Platelets 156 150 - 400 K/uL   nRBC 0.0 0.0 - 0.2 %    Comment: Performed at [facility], [place] [address], [place], [place] [address]   No results found.    Blood pressure (!) 146/89, pulse (!) 54, temperature 98 F (36.7 C), temperature source Oral, resp. rate 18, height 6' 1"  (1.854  m), weight 78 kg, SpO2 98 %. Physical Exam Vitals reviewed.  Constitutional:      General: He is not in acute distress.    Appearance: Normal appearance.  HENT:     Head: Normocephalic and atraumatic.  Eyes:     General: No scleral icterus.    Conjunctiva/sclera: Conjunctivae normal.  Pulmonary:     Effort: Pulmonary effort is normal. No respiratory distress.  Abdominal:     General: There is no distension.     Palpations: Abdomen is soft.     Tenderness: There is no abdominal tenderness.     Comments: Small supraumbilical hernia.  Musculoskeletal:     Cervical back: Normal range of motion.  Skin:    General: Skin is warm and dry.  Neurological:     General: No focal deficit present.     Mental Status: He is alert and oriented to person, place, and time.      Assessment/Plan 71 yo male with umbilical hernia. Proceed to OR for repair with possible mesh placement. Surgery details were reviewed with the patient and informed consent was obtained. Plan for discharge home from PACU.  Dwan Bolt, MD 08/29/2021, 10:19 AM

## 2021-08-29 NOTE — Anesthesia Preprocedure Evaluation (Signed)
Anesthesia Evaluation  Patient identified by MRN, date of birth, ID band Patient awake    Reviewed: Allergy & Precautions, H&P , NPO status , Patient's Chart, lab work & pertinent test results  Airway Mallampati: II   Neck ROM: full    Dental   Pulmonary neg pulmonary ROS,    breath sounds clear to auscultation       Cardiovascular negative cardio ROS   Rhythm:regular Rate:Normal     Neuro/Psych PSYCHIATRIC DISORDERS Depression  Neuromuscular disease    GI/Hepatic   Endo/Other    Renal/GU      Musculoskeletal  (+) Arthritis ,   Abdominal   Peds  Hematology   Anesthesia Other Findings   Reproductive/Obstetrics                             Anesthesia Physical Anesthesia Plan  ASA: 2  Anesthesia Plan: General   Post-op Pain Management:    Induction: Intravenous  PONV Risk Score and Plan: 2 and Ondansetron, Dexamethasone and Treatment may vary due to age or medical condition  Airway Management Planned: Oral ETT  Additional Equipment:   Intra-op Plan:   Post-operative Plan: Extubation in OR  Informed Consent: I have reviewed the patients History and Physical, chart, labs and discussed the procedure including the risks, benefits and alternatives for the proposed anesthesia with the patient or authorized representative who has indicated his/her understanding and acceptance.     Dental advisory given  Plan Discussed with: CRNA, Anesthesiologist and Surgeon  Anesthesia Plan Comments:         Anesthesia Quick Evaluation

## 2021-08-30 ENCOUNTER — Encounter (HOSPITAL_COMMUNITY): Payer: Self-pay | Admitting: Surgery

## 2021-08-30 NOTE — Anesthesia Postprocedure Evaluation (Signed)
Anesthesia Post Note  Patient: Calvin Howe  Procedure(s) Performed: HERNIA REPAIR UMBILICAL ADULT     Patient location during evaluation: PACU Anesthesia Type: General Level of consciousness: awake and alert Pain management: pain level controlled Vital Signs Assessment: post-procedure vital signs reviewed and stable Respiratory status: spontaneous breathing, nonlabored ventilation, respiratory function stable and patient connected to nasal cannula oxygen Cardiovascular status: blood pressure returned to baseline and stable Postop Assessment: no apparent nausea or vomiting Anesthetic complications: no   No notable events documented.  Last Vitals:  Vitals:   08/29/21 1215 08/29/21 1230  BP: (!) 146/73 (!) 177/73  Pulse: (!) 52 (!) 52  Resp: 12 (!) 8  Temp:  36.5 C  SpO2: 98% 97%    Last Pain:  Vitals:   08/29/21 1230  TempSrc:   PainSc: 0-No pain                 Veatrice Eckstein S

## 2021-09-17 DIAGNOSIS — C61 Malignant neoplasm of prostate: Secondary | ICD-10-CM | POA: Diagnosis not present

## 2021-09-18 DIAGNOSIS — Z09 Encounter for follow-up examination after completed treatment for conditions other than malignant neoplasm: Secondary | ICD-10-CM | POA: Diagnosis not present

## 2021-09-19 DIAGNOSIS — Z8546 Personal history of malignant neoplasm of prostate: Secondary | ICD-10-CM | POA: Diagnosis not present

## 2021-09-19 DIAGNOSIS — N5201 Erectile dysfunction due to arterial insufficiency: Secondary | ICD-10-CM | POA: Diagnosis not present

## 2021-09-19 DIAGNOSIS — R300 Dysuria: Secondary | ICD-10-CM | POA: Diagnosis not present

## 2021-09-21 DIAGNOSIS — H5203 Hypermetropia, bilateral: Secondary | ICD-10-CM | POA: Diagnosis not present

## 2021-09-21 DIAGNOSIS — H2513 Age-related nuclear cataract, bilateral: Secondary | ICD-10-CM | POA: Diagnosis not present

## 2021-09-21 DIAGNOSIS — H25041 Posterior subcapsular polar age-related cataract, right eye: Secondary | ICD-10-CM | POA: Diagnosis not present

## 2021-09-25 DIAGNOSIS — C61 Malignant neoplasm of prostate: Secondary | ICD-10-CM | POA: Diagnosis not present

## 2021-10-09 DIAGNOSIS — W57XXXA Bitten or stung by nonvenomous insect and other nonvenomous arthropods, initial encounter: Secondary | ICD-10-CM | POA: Diagnosis not present

## 2021-10-09 DIAGNOSIS — L821 Other seborrheic keratosis: Secondary | ICD-10-CM | POA: Diagnosis not present

## 2021-10-09 DIAGNOSIS — X32XXXD Exposure to sunlight, subsequent encounter: Secondary | ICD-10-CM | POA: Diagnosis not present

## 2021-10-09 DIAGNOSIS — Z1283 Encounter for screening for malignant neoplasm of skin: Secondary | ICD-10-CM | POA: Diagnosis not present

## 2021-10-09 DIAGNOSIS — L570 Actinic keratosis: Secondary | ICD-10-CM | POA: Diagnosis not present

## 2021-11-27 DIAGNOSIS — H2513 Age-related nuclear cataract, bilateral: Secondary | ICD-10-CM | POA: Diagnosis not present

## 2021-11-27 DIAGNOSIS — H18413 Arcus senilis, bilateral: Secondary | ICD-10-CM | POA: Diagnosis not present

## 2021-11-27 DIAGNOSIS — H2511 Age-related nuclear cataract, right eye: Secondary | ICD-10-CM | POA: Diagnosis not present

## 2021-11-30 ENCOUNTER — Encounter: Payer: Self-pay | Admitting: Cardiology

## 2021-11-30 ENCOUNTER — Ambulatory Visit: Payer: Medicare Other | Admitting: Cardiology

## 2021-11-30 VITALS — BP 138/78 | HR 58 | Temp 98.6°F | Resp 16 | Ht 73.0 in | Wt 172.8 lb

## 2021-11-30 DIAGNOSIS — R001 Bradycardia, unspecified: Secondary | ICD-10-CM

## 2021-11-30 DIAGNOSIS — I493 Ventricular premature depolarization: Secondary | ICD-10-CM

## 2021-11-30 NOTE — Progress Notes (Signed)
ID:  Calvin Howe, DOB [birth year], MRN [id number]  PCP:  Aura Dials, MD  Cardiologist:  Rex Kras, DO, [facility] (established care 07/12/2019)  Date: 11/30/21 Last Office Visit: 11/30/2020   Chief Complaint  Patient presents with   PVC's   Follow-up    1 year    HPI  Calvin Howe is a 71 y.o. male who presents to the office with a  chief complaint of " 1 year follow-up for PVC/history of near syncope."  His past medical history and cardiovascular risk factors are: BPPV, PVC, near syncope, advanced age.   Patient presents to the office for evaluation and management of PVC burden.  He presents today for 1 year follow-up visit.  In the past the cardiac monitor noted a PVC burden of approximately 2.6% and we had discussed considering AV nodal blocking agents.  However, patient chose not to be on pharmacological therapy.    In the past his blood pressures in the office have always been high and suggestive of benign essential hypertension.  However, patient states that his home blood pressures are very well controlled.  He is not on any antihypertensive medications.  Over the last 1 year he denies any anginal discomfort or heart failure symptoms.  He did have a hernia surgery in June 2023 and at that time his blood pressures were elevated likely secondary to pain.   FUNCTIONAL STATUS: No structured exercise or daily routine.  But active with doing wood working on a new house and able to go up several flights of stairs without any effort related symptoms.   ALLERGIES: No Known Allergies  MEDICATION LIST PRIOR TO VISIT: Current Meds  Medication Sig   alfuzosin (UROXATRAL) 10 MG 24 hr tablet Take 10 mg by mouth daily.   Ascorbic Acid (VITAMIN C) 1000 MG tablet Take 1,000 mg by mouth daily.   buPROPion (WELLBUTRIN XL) 300 MG 24 hr tablet Take 300 mg by mouth every morning.   ibuprofen (ADVIL) 800 MG tablet Take 800 mg by mouth daily as needed (arthritis pain).   Probiotic Product  (ALIGN) 4 MG CAPS Take 4 mg by mouth daily.   TURMERIC PO Take 2 capsules by mouth daily. turmeric boswellia     PAST MEDICAL HISTORY: Past Medical History:  Diagnosis Date   Arthritis    Carpal tunnel syndrome    Depression    Dysrhythmia    Eczema    Epididymal cyst    right   History of hydrocele    Lower back pain    in am gets better after moves around   Near syncope 06/2019 last episode   Prostate cancer ([place])    PVC (premature ventricular contraction)    Right carpal tunnel syndrome 06/26/2015   Sinus bradycardia    Wears glasses     PAST SURGICAL HISTORY: Past Surgical History:  Procedure Laterality Date   COLONOSCOPY WITH PROPOFOL  Aug 2016, another since 2016   CYSTOSCOPY N/A 01/03/2020   Procedure: CYSTOSCOPY FLEXIBLE;  Surgeon: Franchot Gallo, MD;  Location: [facility];  Service: Urology;  Laterality: N/A;  No seeds detected in bladder per Dr. Diona Fanti   EPIDIDYMECTOMY Right 12/12/2014   Procedure: EPIDIDYMAL CYST EXCISION;  Surgeon: Cleon Gustin, MD;  Location: [facility];  Service: Urology;  Laterality: Right;   HYDROCELE EXCISION Right 06-01-2007   PROSTATE BIOPSY  2021   RADIOACTIVE SEED IMPLANT N/A 01/03/2020   Procedure: RADIOACTIVE SEED IMPLANT/BRACHYTHERAPY IMPLANT;  Surgeon: Franchot Gallo, MD;  Location: [facility];  Service: Urology;  Laterality: N/A;  90 MINS   SPACE OAR INSTILLATION N/A 01/03/2020   Procedure: SPACE OAR INSTILLATION;  Surgeon: Franchot Gallo, MD;  Location: [facility];  Service: Urology;  Laterality: N/A;   UMBILICAL HERNIA REPAIR N/A 08/29/2021   Procedure: HERNIA REPAIR UMBILICAL ADULT;  Surgeon: Dwan Bolt, MD;  Location: [facility];  Service: General;  Laterality: N/A;    FAMILY HISTORY: The patient family history includes Arthritis in his mother; Cerebral palsy in his sister; Heart disease in his father and mother; Multiple myeloma in his father;  Supraventricular tachycardia in his mother.  SOCIAL HISTORY:  The patient  reports that he has never smoked. He has never used smokeless tobacco. He reports current alcohol use. He reports that he does not use drugs.  REVIEW OF SYSTEMS: Review of Systems  Cardiovascular:  Negative for chest pain, claudication, dyspnea on exertion, irregular heartbeat, leg swelling, near-syncope, orthopnea, palpitations, paroxysmal nocturnal dyspnea and syncope.  Respiratory:  Negative for shortness of breath.   Hematologic/Lymphatic: Negative for bleeding problem.  Musculoskeletal:  Negative for muscle cramps and myalgias.  Neurological:  Negative for dizziness and light-headedness.    PHYSICAL EXAM:    11/30/2021   10:10 AM 08/29/2021   12:30 PM 08/29/2021   12:15 PM  Vitals with BMI  Height $Remov'6\' 1"'SSmWOk$     Weight 172 lbs 13 oz    BMI 31.5    Systolic 400 867 619  Diastolic 78 73 73  Pulse 58 52 52   Physical Exam  Constitutional: No distress.  Age appropriate, hemodynamically stable.   Neck: No JVD present.  Cardiovascular: Regular rhythm, S1 normal, S2 normal, intact distal pulses and normal pulses. Bradycardia present. Exam reveals no gallop, no S3 and no S4.  No murmur heard. Pulses:      Dorsalis pedis pulses are 2+ on the right side and 2+ on the left side.       Posterior tibial pulses are 2+ on the right side and 2+ on the left side.  Pulmonary/Chest: Effort normal and breath sounds normal. No stridor. He has no wheezes. He has no rales.  Abdominal: Soft. Bowel sounds are normal. He exhibits no distension. There is no abdominal tenderness.  Musculoskeletal:        General: No edema.     Cervical back: Neck supple.  Neurological: He is alert and oriented to person, place, and time. He has intact cranial nerves (2-12).  Skin: Skin is warm and moist.   CARDIAC DATABASE: EKG: 11/30/2021: Sinus bradycardia, 53 bpm, poor R wave progression, without underlying injury  pattern  Echocardiogram: 07/16/2019: LVEF 50%, grade 1 diastolic impairment, normal LAP, mild to moderate MR, mild TR.  Stress Testing: Exercise treadmill stress test 12/21/2020: Functional status: Average.  Chest pain: No.  Reason for stopping exercise: Fatigue/weakness.  Hypertensive response to exercise: No.  Exercise time 7 minutes 59 seconds on Bruce protocol, achieved 10.13 METS, 103% of age-predicted maximum heart rate. Stress ECG negative for ischemia.  Low risk study.  Heart Catheterization: None  Carotid artery duplex  07/16/2019: No hemodynamically significant arterial disease in the internal carotid artery bilaterally.  Mild soft plaque noted in bilateral carotid arteries.  Antegrade right vertebral artery flow. Antegrade left vertebral artery flow.   24 hour Holter monitor: Dominant rhythm sinus bradycardia.  Heart rate 42-133 bpm.  Average heart rate 61 bpm.  No atrial fibrillation/atrial flutter/supraventricular tachycardia/high grade AV block, sinus pause greater  than or equal to 3 seconds in duration.  Ventricular ectopy beats 2289, with 181 ventricular pairs, 13 ventricular trigeminy, 2 ventricular runs 3 beats in duration, and total ventricular ectopic burden 2.55%.  Supraventricular ectopy beats 856, and total supraventricular ectopic burden 0.95%.  Number of patient triggered events: 1.  Underlying rhythm sinus bradycardia without ectopy.  LABORATORY DATA: Labs from 05/28/2019 reviewed. Creatinine 1.09 mg/dL. eGFR: 80 mL/min per 1.73 m Lipid profile: Total cholesterol 156, triglycerides 79, HDL 45, LDL 96, non-HDL 111  IMPRESSION:    ICD-10-CM   1. PVC's (premature ventricular contractions)  I49.3 EKG 12-Lead    PCV ECHOCARDIOGRAM COMPLETE    2. Sinus bradycardia  R00.1 PCV ECHOCARDIOGRAM COMPLETE       RECOMMENDATIONS: Christin Mccreedy is a 71 y.o. male whose past medical history and cardiac risk factors include: BPPV, PVC, near syncope, advanced  age.  PVC's (premature ventricular contractions) Prior history of having a PVC burden of approximately 2.6%. Chose not to be on pharmacological therapy. EKG today shows sinus bradycardia without ectopy Monitor for now  Sinus bradycardia Asymptomatic. Since last office visit he did have a GXT which noted excellent functional capacity for age.  Good chronotropic competence. Monitor for now  Patient's blood pressures are elevated on multiple office visits.  I suspect that he does have a degree of benign essential hypertension but has been reluctant to be on antihypertensive medications given his history of sinus bradycardia and near syncope.  I have asked him to keep a log of his blood pressures at home and to review it with either myself or PCP.  Would recommend a goal SBP of around 130 mmHg but no more than 140 mmHg.  Patient is agreeable with the plan of care and will follow-up with either PCP or myself.  We will order an echocardiogram prior to the next office visit to reevaluate LVEF, valvular heart disease, regional wall motion abnormalities as a 3-year follow-up study  FINAL MEDICATION LIST END OF ENCOUNTER: No orders of the defined types were placed in this encounter.    Current Outpatient Medications:    alfuzosin (UROXATRAL) 10 MG 24 hr tablet, Take 10 mg by mouth daily., Disp: , Rfl:    Ascorbic Acid (VITAMIN C) 1000 MG tablet, Take 1,000 mg by mouth daily., Disp: , Rfl:    buPROPion (WELLBUTRIN XL) 300 MG 24 hr tablet, Take 300 mg by mouth every morning., Disp: , Rfl:    ibuprofen (ADVIL) 800 MG tablet, Take 800 mg by mouth daily as needed (arthritis pain)., Disp: , Rfl:    Probiotic Product (ALIGN) 4 MG CAPS, Take 4 mg by mouth daily., Disp: , Rfl:    TURMERIC PO, Take 2 capsules by mouth daily. turmeric boswellia, Disp: , Rfl:   Orders Placed This Encounter  Procedures   EKG 12-Lead   PCV ECHOCARDIOGRAM COMPLETE    --Continue cardiac medications as reconciled in final  medication list. --Return in about 1 year (around 12/01/2022) for Annual follow up visit. Or sooner if needed. --Continue follow-up with your primary care physician regarding the management of your other chronic comorbid conditions.  Patient's questions and concerns were addressed to his satisfaction. He voices understanding of the instructions provided during this encounter.   This note was created using a voice recognition software as a result there may be grammatical errors inadvertently enclosed that do not reflect the nature of this encounter. Every attempt is made to correct such errors.  Mindee Robledo Terri Skains, DO, [facility]  Pager:  [phone] Office: [phone]

## 2021-12-21 DIAGNOSIS — L570 Actinic keratosis: Secondary | ICD-10-CM | POA: Diagnosis not present

## 2021-12-21 DIAGNOSIS — C44319 Basal cell carcinoma of skin of other parts of face: Secondary | ICD-10-CM | POA: Diagnosis not present

## 2021-12-21 DIAGNOSIS — X32XXXD Exposure to sunlight, subsequent encounter: Secondary | ICD-10-CM | POA: Diagnosis not present

## 2022-01-21 DIAGNOSIS — M47816 Spondylosis without myelopathy or radiculopathy, lumbar region: Secondary | ICD-10-CM | POA: Diagnosis not present

## 2022-01-21 DIAGNOSIS — M4316 Spondylolisthesis, lumbar region: Secondary | ICD-10-CM | POA: Diagnosis not present

## 2022-01-21 DIAGNOSIS — M5136 Other intervertebral disc degeneration, lumbar region: Secondary | ICD-10-CM | POA: Diagnosis not present

## 2022-01-21 DIAGNOSIS — M549 Dorsalgia, unspecified: Secondary | ICD-10-CM | POA: Diagnosis not present

## 2022-01-21 DIAGNOSIS — M47814 Spondylosis without myelopathy or radiculopathy, thoracic region: Secondary | ICD-10-CM | POA: Diagnosis not present

## 2022-02-01 DIAGNOSIS — D225 Melanocytic nevi of trunk: Secondary | ICD-10-CM | POA: Diagnosis not present

## 2022-02-01 DIAGNOSIS — L820 Inflamed seborrheic keratosis: Secondary | ICD-10-CM | POA: Diagnosis not present

## 2022-02-01 DIAGNOSIS — Z85828 Personal history of other malignant neoplasm of skin: Secondary | ICD-10-CM | POA: Diagnosis not present

## 2022-02-01 DIAGNOSIS — Z08 Encounter for follow-up examination after completed treatment for malignant neoplasm: Secondary | ICD-10-CM | POA: Diagnosis not present

## 2022-02-15 DIAGNOSIS — H2511 Age-related nuclear cataract, right eye: Secondary | ICD-10-CM | POA: Diagnosis not present

## 2022-02-15 DIAGNOSIS — H2512 Age-related nuclear cataract, left eye: Secondary | ICD-10-CM | POA: Diagnosis not present

## 2022-03-08 DIAGNOSIS — H2511 Age-related nuclear cataract, right eye: Secondary | ICD-10-CM | POA: Diagnosis not present

## 2022-03-08 DIAGNOSIS — H2512 Age-related nuclear cataract, left eye: Secondary | ICD-10-CM | POA: Diagnosis not present

## 2022-03-15 DIAGNOSIS — C61 Malignant neoplasm of prostate: Secondary | ICD-10-CM | POA: Diagnosis not present

## 2022-03-15 DIAGNOSIS — I493 Ventricular premature depolarization: Secondary | ICD-10-CM | POA: Diagnosis not present

## 2022-03-15 DIAGNOSIS — Z23 Encounter for immunization: Secondary | ICD-10-CM | POA: Diagnosis not present

## 2022-03-15 DIAGNOSIS — R1031 Right lower quadrant pain: Secondary | ICD-10-CM | POA: Diagnosis not present

## 2022-03-15 DIAGNOSIS — F39 Unspecified mood [affective] disorder: Secondary | ICD-10-CM | POA: Diagnosis not present

## 2022-03-20 DIAGNOSIS — C61 Malignant neoplasm of prostate: Secondary | ICD-10-CM | POA: Diagnosis not present

## 2022-03-26 DIAGNOSIS — R1031 Right lower quadrant pain: Secondary | ICD-10-CM | POA: Diagnosis not present

## 2022-03-27 DIAGNOSIS — Z8546 Personal history of malignant neoplasm of prostate: Secondary | ICD-10-CM | POA: Diagnosis not present

## 2022-03-27 DIAGNOSIS — R300 Dysuria: Secondary | ICD-10-CM | POA: Diagnosis not present

## 2022-04-02 DIAGNOSIS — R21 Rash and other nonspecific skin eruption: Secondary | ICD-10-CM | POA: Diagnosis not present

## 2022-04-16 DIAGNOSIS — R252 Cramp and spasm: Secondary | ICD-10-CM | POA: Diagnosis not present

## 2022-04-16 DIAGNOSIS — F39 Unspecified mood [affective] disorder: Secondary | ICD-10-CM | POA: Diagnosis not present

## 2022-04-16 DIAGNOSIS — C61 Malignant neoplasm of prostate: Secondary | ICD-10-CM | POA: Diagnosis not present

## 2022-04-16 DIAGNOSIS — M47896 Other spondylosis, lumbar region: Secondary | ICD-10-CM | POA: Diagnosis not present

## 2022-04-16 DIAGNOSIS — R1031 Right lower quadrant pain: Secondary | ICD-10-CM | POA: Diagnosis not present

## 2022-06-08 IMAGING — DX DG CHEST 2V
2 series · 2 of 2 positions shown · non-contrast
Comparison: January 08, 2016.

CLINICAL DATA: Preop.  History of prostate cancer.

EXAM:
CHEST - 2 VIEW

[chest pa]
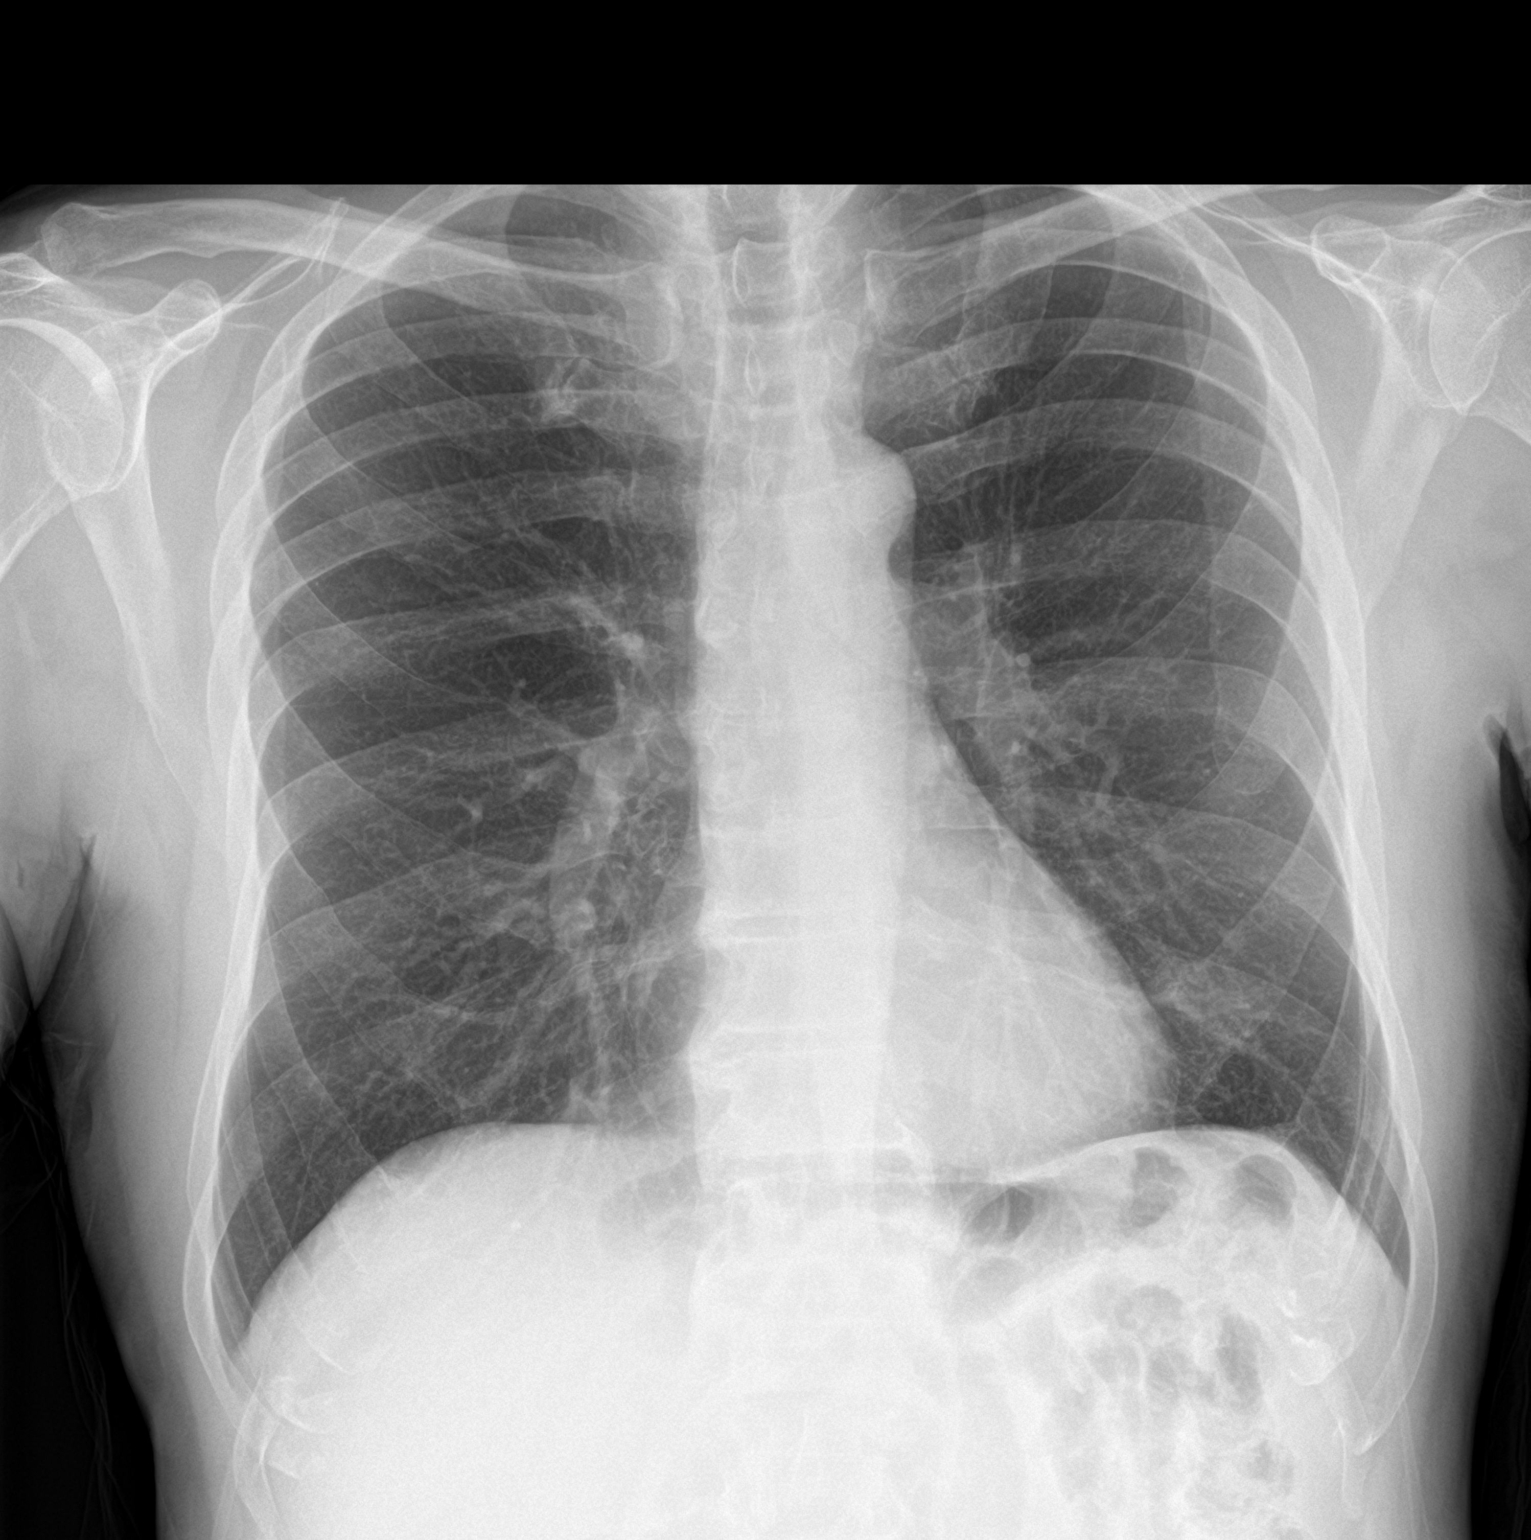

[chest lat]
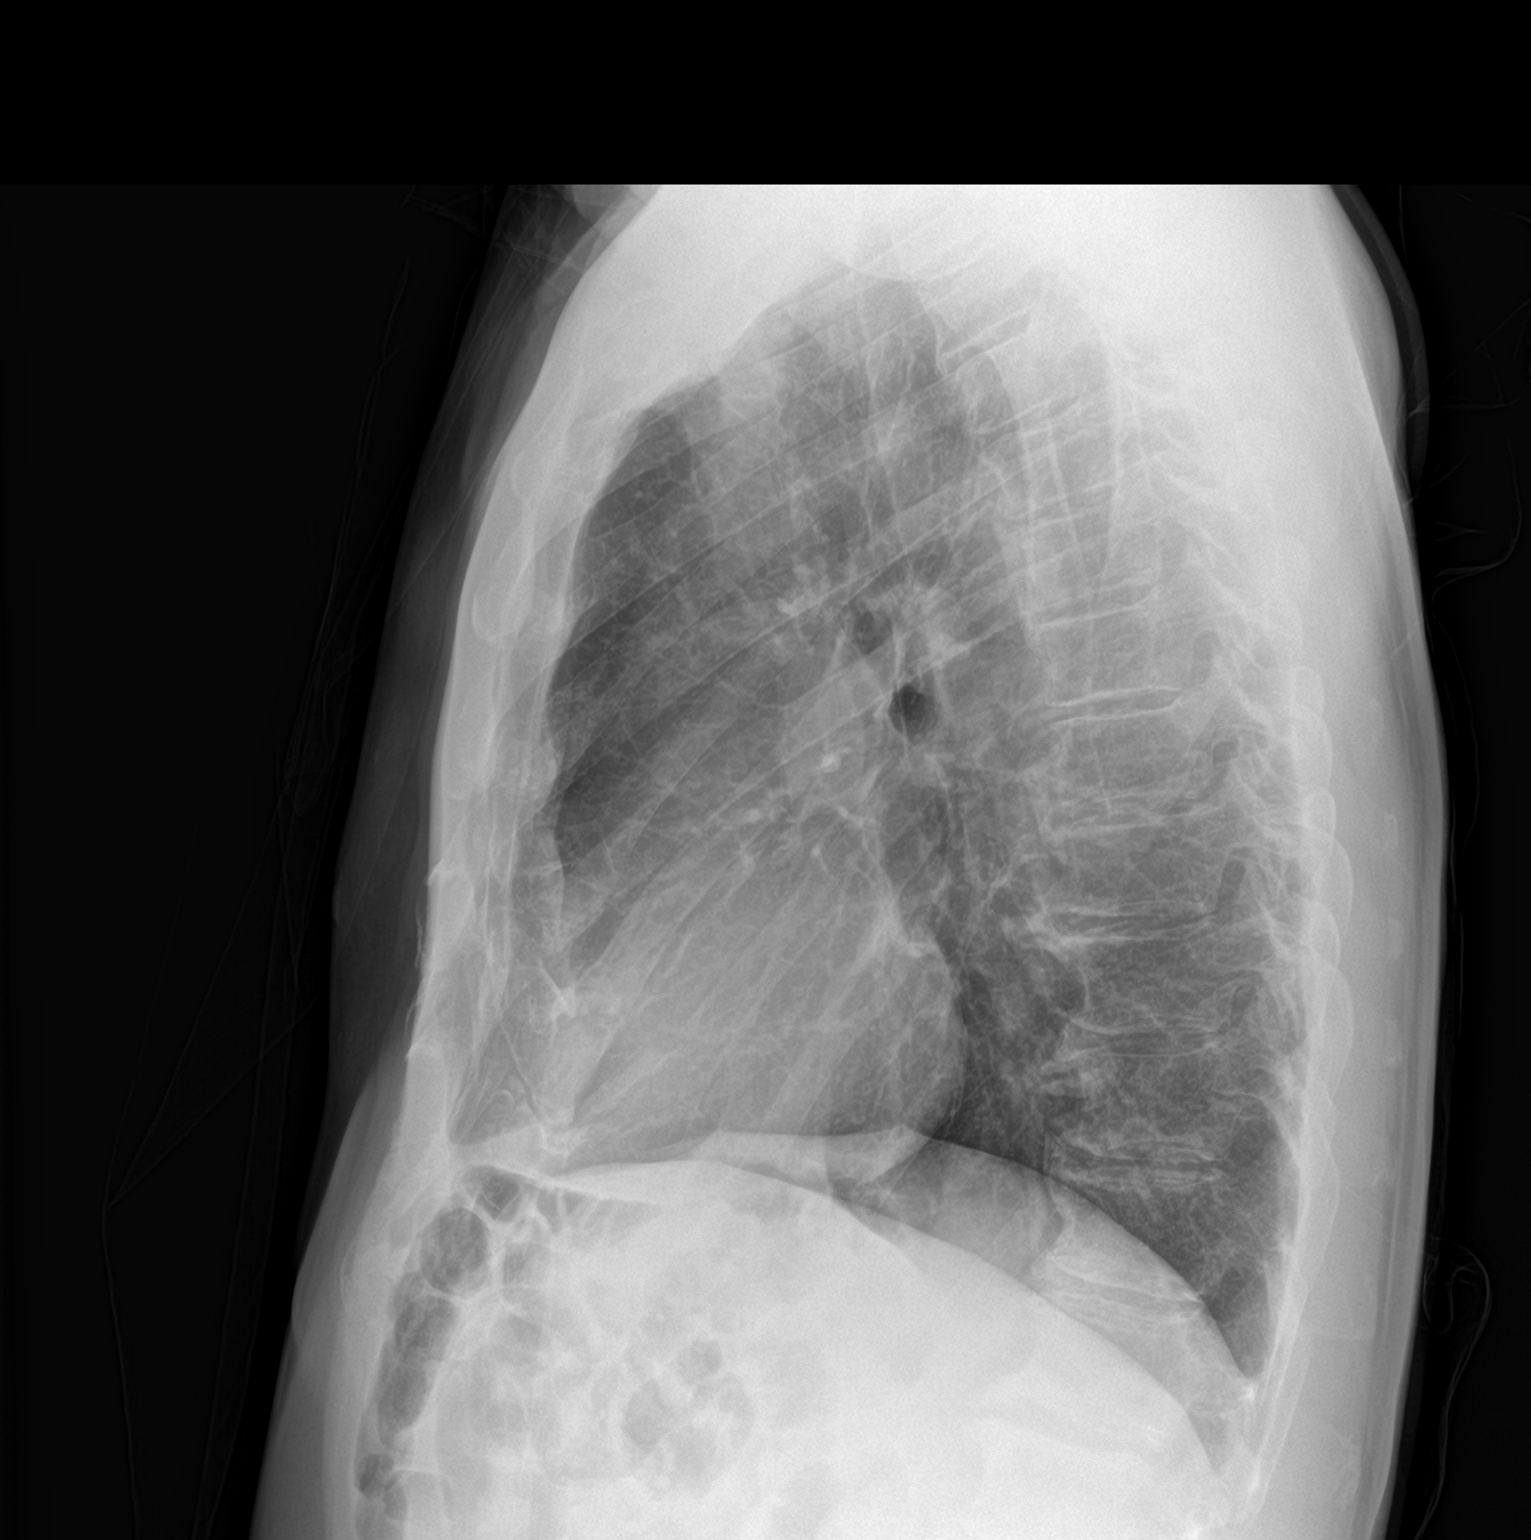

[2 of 2 positions shown; findings below may reference images not displayed]

FINDINGS: The heart size and mediastinal contours are within normal limits.
Both lungs are clear. The visualized skeletal structures are
unremarkable.
IMPRESSION: No active cardiopulmonary disease.

## 2022-08-03 DEATH — deceased

## 2022-11-25 ENCOUNTER — Other Ambulatory Visit: Payer: Federal, State, Local not specified - PPO

## 2022-12-02 ENCOUNTER — Ambulatory Visit: Payer: Federal, State, Local not specified - PPO | Admitting: Cardiology
